# Patient Record
Sex: Male | Born: 1985 | Race: White | Hispanic: No | Marital: Married | Smoking: Current every day smoker
Health system: Southern US, Community
[De-identification: ages and names within clinical notes are randomized; demographics above are authoritative.]

## PROBLEM LIST (undated history)

## (undated) DIAGNOSIS — M1990 Unspecified osteoarthritis, unspecified site: Secondary | ICD-10-CM

## (undated) DIAGNOSIS — M109 Gout, unspecified: Secondary | ICD-10-CM

## (undated) DIAGNOSIS — J45909 Unspecified asthma, uncomplicated: Secondary | ICD-10-CM

## (undated) HISTORY — PX: HAND SURGERY: SHX662

---

## 2007-07-12 ENCOUNTER — Emergency Department (HOSPITAL_COMMUNITY): Admission: EM | Admit: 2007-07-12 | Discharge: 2007-07-12 | Payer: Self-pay | Admitting: Emergency Medicine

## 2013-01-14 ENCOUNTER — Encounter (HOSPITAL_COMMUNITY): Payer: Self-pay | Admitting: *Deleted

## 2013-01-14 ENCOUNTER — Emergency Department (HOSPITAL_COMMUNITY)
Admission: EM | Admit: 2013-01-14 | Discharge: 2013-01-15 | Disposition: A | Discharge status: Home / Self Care | Payer: Self-pay | Attending: Emergency Medicine | Admitting: Emergency Medicine

## 2013-01-14 ENCOUNTER — Emergency Department (HOSPITAL_COMMUNITY): Payer: Self-pay

## 2013-01-14 DIAGNOSIS — K529 Noninfective gastroenteritis and colitis, unspecified: Secondary | ICD-10-CM

## 2013-01-14 DIAGNOSIS — R6883 Chills (without fever): Secondary | ICD-10-CM | POA: Insufficient documentation

## 2013-01-14 DIAGNOSIS — R197 Diarrhea, unspecified: Secondary | ICD-10-CM | POA: Insufficient documentation

## 2013-01-14 DIAGNOSIS — K5289 Other specified noninfective gastroenteritis and colitis: Secondary | ICD-10-CM | POA: Insufficient documentation

## 2013-01-14 DIAGNOSIS — Z8739 Personal history of other diseases of the musculoskeletal system and connective tissue: Secondary | ICD-10-CM | POA: Insufficient documentation

## 2013-01-14 DIAGNOSIS — F17200 Nicotine dependence, unspecified, uncomplicated: Secondary | ICD-10-CM | POA: Insufficient documentation

## 2013-01-14 DIAGNOSIS — M2550 Pain in unspecified joint: Secondary | ICD-10-CM | POA: Insufficient documentation

## 2013-01-14 HISTORY — DX: Unspecified osteoarthritis, unspecified site: M19.90

## 2013-01-14 MED ORDER — PROMETHAZINE HCL 12.5 MG PO TABS
25.0000 mg | ORAL_TABLET | Freq: Once | ORAL | Status: AC
  Administered 2013-01-14: 25 mg via ORAL
  Filled 2013-01-14: qty 2

## 2013-01-14 NOTE — ED Notes (Signed)
Pt with vomiting and diarrhea since yesterday, denies any pain

## 2013-01-14 NOTE — ED Provider Notes (Signed)
CSN: [id number]     Arrival date & time 01/14/13  2138 History   First MD Initiated Contact with Patient 01/14/13 2248     Chief Complaint  Patient presents with  . Emesis  . Diarrhea   (Consider location/radiation/quality/duration/timing/severity/associated sxs/prior Treatment) Patient is a 27 y.o. male presenting with vomiting and diarrhea. The history is provided by the patient.  Emesis Severity:  Moderate Duration:  1 day Number of daily episodes:  4 Quality:  Stomach contents Able to tolerate:  Liquids How soon after eating does vomiting occur:  30 minutes Progression:  Worsening Chronicity:  New Recent urination:  Normal Relieved by:  Nothing Ineffective treatments:  None tried Associated symptoms: arthralgias, chills and diarrhea   Associated symptoms: no abdominal pain, no fever, no myalgias and no sore throat   Diarrhea:    Quality:  Watery   Number of occurrences:  2   Severity:  Moderate   Duration:  1 day   Timing:  Intermittent   Progression:  Unchanged Risk factors: no diabetes and no suspect food intake   Diarrhea Associated symptoms: arthralgias, chills and vomiting   Associated symptoms: no abdominal pain and no myalgias     Past Medical History  Diagnosis Date  . Arthritis    Past Surgical History  Procedure Laterality Date  . Hand surgery     History reviewed. No pertinent family history. History  Substance Use Topics  . Smoking status: Current Every Day Smoker    Types: Cigarettes  . Smokeless tobacco: Not on file  . Alcohol Use: No    Review of Systems  Constitutional: Positive for chills. Negative for activity change.       All ROS Neg except as noted in HPI  HENT: Negative for nosebleeds, sore throat and neck pain.   Eyes: Negative for photophobia and discharge.  Respiratory: Negative for cough, shortness of breath and wheezing.   Cardiovascular: Negative for chest pain and palpitations.  Gastrointestinal: Positive for vomiting and  diarrhea. Negative for abdominal pain and blood in stool.  Genitourinary: Negative for dysuria, frequency and hematuria.  Musculoskeletal: Positive for arthralgias. Negative for myalgias and back pain.  Skin: Negative.   Neurological: Negative for dizziness, seizures and speech difficulty.  Psychiatric/Behavioral: Negative for hallucinations and confusion.    Allergies  Sulfa antibiotics and Yellow jacket venom  Home Medications   Current Outpatient Rx  Name  Route  Sig  Dispense  Refill  . acetaminophen (TYLENOL) 500 MG tablet   Oral   Take 500 mg by mouth every 6 (six) hours as needed for pain.          BP 124/77  Pulse 88  Temp(Src) 97.9 F (36.6 C) (Oral)  Resp 18  SpO2 97% Physical Exam  Nursing note and vitals reviewed. Constitutional: He is oriented to person, place, and time. He appears well-developed and well-nourished.  Non-toxic appearance.  HENT:  Head: Normocephalic.  Right Ear: Tympanic membrane and external ear normal.  Left Ear: Tympanic membrane and external ear normal.  Eyes: EOM and lids are normal. Pupils are equal, round, and reactive to light.  Neck: Normal range of motion. Neck supple. Carotid bruit is not present.  Cardiovascular: Normal rate, regular rhythm, normal heart sounds, intact distal pulses and normal pulses.   Pulmonary/Chest: Breath sounds normal. No respiratory distress.  Abdominal: Soft. Bowel sounds are normal. There is no tenderness. There is no guarding.  Musculoskeletal: Normal range of motion.  Lymphadenopathy:  Head (right side): No submandibular adenopathy present.       Head (left side): No submandibular adenopathy present.    He has no cervical adenopathy.  Neurological: He is alert and oriented to person, place, and time. He has normal strength. No cranial nerve deficit or sensory deficit.  Skin: Skin is warm and dry.  Psychiatric: He has a normal mood and affect. His speech is normal.    ED Course  Procedures  (including critical care time) Labs Review Labs Reviewed - No data to display Imaging Review No results found. Pulse ox 97% on room air. WNL by my interpretation. MDM  No diagnosis found. *I have reviewed nursing notes, vital signs, and all appropriate lab and imaging results for this patient.**  Vital signs stable. No orthostatic changes. Acute Abd xray is negative for acute problem. Pt given oral promethazine. No vomiting or diarrhea in the ED. Pt able to drink without problem in ED. Plan - wash hands frequently. Rx for promethazine. Return if not improving.  Kathie Dike, PA-C 01/15/13 [phone]

## 2013-01-15 MED ORDER — PROMETHAZINE HCL 25 MG PO TABS: ORAL_TABLET | ORAL | Status: DC

## 2013-01-15 NOTE — ED Notes (Signed)
Patient tolerating fluids well. No vomiting or diarrhea since in ED. Patient sleeping when this nurse entered room to reassess.

## 2013-01-15 NOTE — ED Provider Notes (Signed)
Medical screening examination/treatment/procedure(s) were performed by non-physician practitioner and as supervising physician I was immediately available for consultation/collaboration.   Addelynn Batte W Veta Dambrosia, MD 01/15/13 0215 

## 2013-05-11 ENCOUNTER — Encounter (HOSPITAL_COMMUNITY): Payer: Self-pay | Admitting: Emergency Medicine

## 2013-05-11 ENCOUNTER — Emergency Department (HOSPITAL_COMMUNITY)
Admission: EM | Admit: 2013-05-11 | Discharge: 2013-05-11 | Disposition: A | Discharge status: Home / Self Care | Payer: Self-pay | Attending: Emergency Medicine | Admitting: Emergency Medicine

## 2013-05-11 ENCOUNTER — Emergency Department (HOSPITAL_COMMUNITY): Payer: Self-pay

## 2013-05-11 DIAGNOSIS — S8253XA Displaced fracture of medial malleolus of unspecified tibia, initial encounter for closed fracture: Secondary | ICD-10-CM

## 2013-05-11 DIAGNOSIS — M129 Arthropathy, unspecified: Secondary | ICD-10-CM | POA: Insufficient documentation

## 2013-05-11 DIAGNOSIS — R609 Edema, unspecified: Secondary | ICD-10-CM | POA: Insufficient documentation

## 2013-05-11 DIAGNOSIS — Y9389 Activity, other specified: Secondary | ICD-10-CM | POA: Insufficient documentation

## 2013-05-11 DIAGNOSIS — Y929 Unspecified place or not applicable: Secondary | ICD-10-CM | POA: Insufficient documentation

## 2013-05-11 DIAGNOSIS — J45909 Unspecified asthma, uncomplicated: Secondary | ICD-10-CM | POA: Insufficient documentation

## 2013-05-11 DIAGNOSIS — X500XXA Overexertion from strenuous movement or load, initial encounter: Secondary | ICD-10-CM | POA: Insufficient documentation

## 2013-05-11 DIAGNOSIS — S92909A Unspecified fracture of unspecified foot, initial encounter for closed fracture: Secondary | ICD-10-CM | POA: Insufficient documentation

## 2013-05-11 DIAGNOSIS — F17200 Nicotine dependence, unspecified, uncomplicated: Secondary | ICD-10-CM | POA: Insufficient documentation

## 2013-05-11 HISTORY — DX: Unspecified asthma, uncomplicated: J45.909

## 2013-05-11 MED ORDER — TRAMADOL HCL 50 MG PO TABS: 50.0000 mg | ORAL_TABLET | Freq: Four times a day (QID) | ORAL | Status: DC | PRN

## 2013-05-11 NOTE — ED Notes (Signed)
Per pt report: pt was packing bags and "twisted around" the right ankle.  Ankle is swollen.

## 2013-05-11 NOTE — ED Provider Notes (Addendum)
CSN: [id number]     Arrival date & time 05/11/13  2215 History   This chart was scribed for non-physician practitioner Marlon Pel, PA-C, working with Blane Ohara, MD, by Yevette Mcilvain, ED Scribe. This patient was seen in room WTR5/WTR5 and the patient's care was started at 10:56 PM.  First MD Initiated Contact with Patient 05/11/13 2236    Chief Complaint  Patient presents with  . Ankle Pain   The history is provided by the patient. No language interpreter was used.   HPI Comments: Ronald Mitchell is a 27 y.o. male, with a h/o arthritis, who presents to the Emergency Department complaining of acute right ankle pain which began yesterday when he twisted his ankle accidentally while trying to get all of him and his wifes things together to deliver their baby. He waited until this evening because the baby was delivered this morning and he has been unable to leave. He rates his pain as 6/10. The pt has experienced increased swelling associated with the site. He reports increased pain with weight-bearing, and he reports the pain is too great to allow for ambulation. He has a h/o previous injuries to the site. He is a current smoker.   Past Medical History  Diagnosis Date  . Arthritis   . Asthma    Past Surgical History  Procedure Laterality Date  . Hand surgery     No family history on file. History  Substance Use Topics  . Smoking status: Current Every Day Smoker -- 0.50 packs/day    Types: Cigarettes  . Smokeless tobacco: Not on file  . Alcohol Use: No    Review of Systems  Musculoskeletal: Positive for arthralgias.  All other systems reviewed and are negative.    Allergies  Sulfa antibiotics and Yellow jacket venom  Home Medications   Current Outpatient Rx  Name  Route  Sig  Dispense  Refill  . acetaminophen (TYLENOL) 500 MG tablet   Oral   Take 500 mg by mouth every 6 (six) hours as needed for pain.         . promethazine (PHENERGAN) 25 MG tablet      1 or  2 po q6h prn n/v.   15 tablet   0   . traMADol (ULTRAM) 50 MG tablet   Oral   Take 1 tablet (50 mg total) by mouth every 6 (six) hours as needed.   15 tablet   0    Triage Vitals: BP 139/80  Pulse 118  Resp 20  SpO2 99%  Physical Exam  Nursing note and vitals reviewed. Constitutional: He is oriented to person, place, and time. He appears well-developed and well-nourished. No distress.  HENT:  Head: Normocephalic and atraumatic.  Eyes: EOM are normal.  Neck: Neck supple. No tracheal deviation present.  Cardiovascular: Normal rate.   Pulmonary/Chest: Effort normal. No respiratory distress.  Musculoskeletal: Normal range of motion. He exhibits tenderness.  Significant swelling to right ankle. Tenderness most prominent at right medial malleoli. Achilles is intact. Strong pulses. Full ROM of toes.   Neurological: He is alert and oriented to person, place, and time.  Skin: Skin is warm and dry.  Psychiatric: He has a normal mood and affect. His behavior is normal.    ED Course  Procedures (including critical care time)  DIAGNOSTIC STUDIES: Oxygen Saturation is 99% on room air, normal by my interpretation.    COORDINATION OF CARE:  11:00 PM- Discussed treatment plan with patient, which includes a cam-walker  boot.and a prescription for Ultram We discussed immobilizing with splint but patient was hesitant because he has a new born child and will be unable to help if he cant walk.  The patient agreed to the plan. Also reminded the pt to continue icing the site. He understands need to follow-up with Ortho.  Labs Review Labs Reviewed - No data to display Imaging Review Dg Ankle Complete Right  05/11/2013   CLINICAL DATA:  Right ankle pain.  No injury.  EXAM: RIGHT ANKLE - COMPLETE 3+ VIEW  COMPARISON:  None.  FINDINGS: There is a tiny bone fragment adjacent to the medial malleolus, likely an old avulsion. Soft tissue swelling is present on both sides of the ankle on the frontal  view. The ankle mortise is congruent. The talar dome is intact. There is no fracture or acute osseous abnormality. On the lateral view, there does appear to be an ankle effusion.  IMPRESSION: Small avulsion fragment from the medial malleolus may be old. Ankle effusion.   Electronically Signed   By: Andreas Newport M.D.   On: 05/11/2013 22:49    EKG Interpretation   None       MDM   1. Fx medial malleolus-closed    27 y.o.Marolyn Hammock Kopko's evaluation in the Emergency Department is complete. It has been determined that no acute conditions requiring further emergency intervention are present at this time. The patient/guardian have been advised of the diagnosis and plan. We have discussed signs and symptoms that warrant return to the ED, such as changes or worsening in symptoms.  Vital signs are stable at discharge. Filed Vitals:   05/11/13 2217  BP: 139/80  Pulse: 118  Resp: 20    Patient/guardian has voiced understanding and agreed to follow-up with the PCP or specialist.  I personally performed the services described in this documentation, which was scribed in my presence. The recorded information has been reviewed and is accurate.     Dorthula Matas, PA-C 05/12/13 0023  Dorthula Matas, PA-C 06/09/13 1715

## 2013-05-11 NOTE — ED Notes (Signed)
Pt in the restroom.

## 2013-05-12 NOTE — ED Provider Notes (Signed)
Medical screening examination/treatment/procedure(s) were performed by non-physician practitioner and as supervising physician I was immediately available for consultation/collaboration.  EKG Interpretation   None         Hennessy Bartel M Myrtice Lowdermilk, MD 05/12/13 0814 

## 2013-06-10 NOTE — ED Provider Notes (Signed)
Medical screening examination/treatment/procedure(s) were performed by non-physician practitioner and as supervising physician I was immediately available for consultation/collaboration.  EKG Interpretation   None         Ronald SkeensJoshua M Acadia Thammavong, MD 06/10/13 [phone]

## 2014-05-31 ENCOUNTER — Encounter (HOSPITAL_COMMUNITY): Payer: Self-pay

## 2014-05-31 ENCOUNTER — Emergency Department (HOSPITAL_COMMUNITY): Payer: PRIVATE HEALTH INSURANCE

## 2014-05-31 ENCOUNTER — Emergency Department (HOSPITAL_COMMUNITY)
Admission: EM | Admit: 2014-05-31 | Discharge: 2014-05-31 | Disposition: A | Discharge status: Home / Self Care | Payer: PRIVATE HEALTH INSURANCE | Attending: Emergency Medicine | Admitting: Emergency Medicine

## 2014-05-31 DIAGNOSIS — Z720 Tobacco use: Secondary | ICD-10-CM | POA: Diagnosis not present

## 2014-05-31 DIAGNOSIS — M1990 Unspecified osteoarthritis, unspecified site: Secondary | ICD-10-CM | POA: Diagnosis not present

## 2014-05-31 DIAGNOSIS — Z7952 Long term (current) use of systemic steroids: Secondary | ICD-10-CM | POA: Insufficient documentation

## 2014-05-31 DIAGNOSIS — R509 Fever, unspecified: Secondary | ICD-10-CM

## 2014-05-31 DIAGNOSIS — R0789 Other chest pain: Secondary | ICD-10-CM | POA: Diagnosis not present

## 2014-05-31 DIAGNOSIS — Z792 Long term (current) use of antibiotics: Secondary | ICD-10-CM | POA: Insufficient documentation

## 2014-05-31 DIAGNOSIS — R51 Headache: Secondary | ICD-10-CM | POA: Diagnosis not present

## 2014-05-31 DIAGNOSIS — J45909 Unspecified asthma, uncomplicated: Secondary | ICD-10-CM | POA: Diagnosis not present

## 2014-05-31 DIAGNOSIS — J40 Bronchitis, not specified as acute or chronic: Secondary | ICD-10-CM

## 2014-05-31 DIAGNOSIS — R05 Cough: Secondary | ICD-10-CM | POA: Diagnosis present

## 2014-05-31 DIAGNOSIS — J45901 Unspecified asthma with (acute) exacerbation: Secondary | ICD-10-CM

## 2014-05-31 LAB — CBC WITH DIFFERENTIAL/PLATELET
BASOS ABS: 0.1 10*3/uL (ref 0.0–0.1)
Basophils Relative: 1 % (ref 0–1)
Eosinophils Absolute: 0.1 10*3/uL (ref 0.0–0.7)
Eosinophils Relative: 1 % (ref 0–5)
HEMATOCRIT: 40.6 % (ref 39.0–52.0)
Hemoglobin: 13.9 g/dL (ref 13.0–17.0)
LYMPHS PCT: 22 % (ref 12–46)
Lymphs Abs: 2.7 10*3/uL (ref 0.7–4.0)
MCH: 32 pg (ref 26.0–34.0)
MCHC: 34.2 g/dL (ref 30.0–36.0)
MCV: 93.3 fL (ref 78.0–100.0)
MONO ABS: 1.7 10*3/uL — AB (ref 0.1–1.0)
Monocytes Relative: 14 % — ABNORMAL HIGH (ref 3–12)
NEUTROS PCT: 63 % (ref 43–77)
Neutro Abs: 7.9 10*3/uL — ABNORMAL HIGH (ref 1.7–7.7)
PLATELETS: 205 10*3/uL (ref 150–400)
RBC: 4.35 MIL/uL (ref 4.22–5.81)
RDW: 13.1 % (ref 11.5–15.5)
WBC: 12.6 10*3/uL — AB (ref 4.0–10.5)

## 2014-05-31 LAB — COMPREHENSIVE METABOLIC PANEL
ALT: 68 U/L — ABNORMAL HIGH (ref 0–53)
AST: 47 U/L — ABNORMAL HIGH (ref 0–37)
Albumin: 4 g/dL (ref 3.5–5.2)
Alkaline Phosphatase: 52 U/L (ref 39–117)
Anion gap: 6 (ref 5–15)
BILIRUBIN TOTAL: 1 mg/dL (ref 0.3–1.2)
BUN: 15 mg/dL (ref 6–23)
CALCIUM: 8.3 mg/dL — AB (ref 8.4–10.5)
CHLORIDE: 102 meq/L (ref 96–112)
CO2: 27 mmol/L (ref 19–32)
CREATININE: 0.93 mg/dL (ref 0.50–1.35)
GLUCOSE: 109 mg/dL — AB (ref 70–99)
POTASSIUM: 3.5 mmol/L (ref 3.5–5.1)
SODIUM: 135 mmol/L (ref 135–145)
TOTAL PROTEIN: 7.1 g/dL (ref 6.0–8.3)

## 2014-05-31 LAB — RAPID STREP SCREEN (MED CTR MEBANE ONLY): STREPTOCOCCUS, GROUP A SCREEN (DIRECT): NEGATIVE

## 2014-05-31 MED ORDER — IBUPROFEN 800 MG PO TABS
800.0000 mg | ORAL_TABLET | Freq: Once | ORAL | Status: AC
  Administered 2014-05-31: 800 mg via ORAL
  Filled 2014-05-31: qty 1

## 2014-05-31 MED ORDER — IPRATROPIUM-ALBUTEROL 0.5-2.5 (3) MG/3ML IN SOLN
3.0000 mL | Freq: Once | RESPIRATORY_TRACT | Status: AC
  Administered 2014-05-31: 3 mL via RESPIRATORY_TRACT
  Filled 2014-05-31: qty 3

## 2014-05-31 MED ORDER — AZITHROMYCIN 250 MG PO TABS: 250.0000 mg | ORAL_TABLET | Freq: Every day | ORAL | Status: DC

## 2014-05-31 MED ORDER — ALBUTEROL SULFATE HFA 108 (90 BASE) MCG/ACT IN AERS: 2.0000 | INHALATION_SPRAY | Freq: Four times a day (QID) | RESPIRATORY_TRACT | Status: DC | PRN

## 2014-05-31 MED ORDER — PREDNISONE 50 MG PO TABS: ORAL_TABLET | ORAL | Status: DC

## 2014-05-31 MED ORDER — PREDNISONE 50 MG PO TABS
60.0000 mg | ORAL_TABLET | Freq: Once | ORAL | Status: AC
  Administered 2014-05-31: 60 mg via ORAL
  Filled 2014-05-31 (×2): qty 1

## 2014-05-31 NOTE — Discharge Instructions (Signed)
Acute Bronchitis Stop smoking. Take the steroids and antibiotics as prescribed. Use inhaler as needed for difficulty breathing. Return to the ED with new or worsening symptoms. Bronchitis is inflammation of the airways that extend from the windpipe into the lungs (bronchi). The inflammation often causes mucus to develop. This leads to a cough, which is the most common symptom of bronchitis.  In acute bronchitis, the condition usually develops suddenly and goes away over time, usually in a couple weeks. Smoking, allergies, and asthma can make bronchitis worse. Repeated episodes of bronchitis may cause further lung problems.  CAUSES Acute bronchitis is most often caused by the same virus that causes a cold. The virus can spread from person to person (contagious) through coughing, sneezing, and touching contaminated objects. SIGNS AND SYMPTOMS   Cough.   Fever.   Coughing up mucus.   Body aches.   Chest congestion.   Chills.   Shortness of breath.   Sore throat.  DIAGNOSIS  Acute bronchitis is usually diagnosed through a physical exam. Your health care provider will also ask you questions about your medical history. Tests, such as chest X-rays, are sometimes done to rule out other conditions.  TREATMENT  Acute bronchitis usually goes away in a couple weeks. Oftentimes, no medical treatment is necessary. Medicines are sometimes given for relief of fever or cough. Antibiotic medicines are usually not needed but may be prescribed in certain situations. In some cases, an inhaler may be recommended to help reduce shortness of breath and control the cough. A cool mist vaporizer may also be used to help thin bronchial secretions and make it easier to clear the chest.  HOME CARE INSTRUCTIONS  Get plenty of rest.   Drink enough fluids to keep your urine clear or pale yellow (unless you have a medical condition that requires fluid restriction). Increasing fluids may help thin your  respiratory secretions (sputum) and reduce chest congestion, and it will prevent dehydration.   Take medicines only as directed by your health care provider.  If you were prescribed an antibiotic medicine, finish it all even if you start to feel better.  Avoid smoking and secondhand smoke. Exposure to cigarette smoke or irritating chemicals will make bronchitis worse. If you are a smoker, consider using nicotine gum or skin patches to help control withdrawal symptoms. Quitting smoking will help your lungs heal faster.   Reduce the chances of another bout of acute bronchitis by washing your hands frequently, avoiding people with cold symptoms, and trying not to touch your hands to your mouth, nose, or eyes.   Keep all follow-up visits as directed by your health care provider.  SEEK MEDICAL CARE IF: Your symptoms do not improve after 1 week of treatment.  SEEK IMMEDIATE MEDICAL CARE IF:  You develop an increased fever or chills.   You have chest pain.   You have severe shortness of breath.  You have bloody sputum.   You develop dehydration.  You faint or repeatedly feel like you are going to pass out.  You develop repeated vomiting.  You develop a severe headache. MAKE SURE YOU:   Understand these instructions.  Will watch your condition.  Will get help right away if you are not doing well or get worse. Document Released: 06/15/2004 Document Revised: 09/22/2013 Document Reviewed: 10/29/2012 [facility] Patient Information 2015 [employer], [place]. This information is not intended to replace advice given to you by your health care provider. Make sure you discuss any questions you have with your health care  provider.

## 2014-05-31 NOTE — ED Provider Notes (Signed)
CSN: [id number]     Arrival date & time 05/31/14  1141 History  This chart was scribed for Glynn OctaveStephen Ludean Duhart, MD by Murriel HopperAlec Bankhead, ED Scribe. This patient was seen in room APA09/APA09 and the patient's care was started at 12:43 PM.    Chief Complaint  Patient presents with  . Fever  . Cough   The history is provided by the patient. No language interpreter was used.     HPI Comments: Ronald Mitchell is a 29 y.o. male with Asthma who presents to the Emergency Department complaining of a worsening fever with associated cough, congestion, difficulty breathing, sore throat, headache, and chest tightness that began 5 days ago. Pt states that his cough is dry and non-productive. Pt also notes that he has had occasional vomiting induced by his cough, and states that his chest only feels tight or hurts whenever he begins to cough. Pt notes that he has had occasional headaches with other associated symptoms. Pt states he has never had difficulty breathing like this before. Pt states that he has been taking Tylenol PM for his fever, and taking Mucinex as needed for congestion and cough with little relief. Pt states his last dose of Mucinex was around 0830 this AM. Pt states that he is a smoker and smokes half a pack per day. Pt states that he did not receive a flu shot this season.   Past Medical History  Diagnosis Date  . Arthritis   . Asthma    Past Surgical History  Procedure Laterality Date  . Hand surgery     No family history on file. History  Substance Use Topics  . Smoking status: Current Every Day Smoker -- 0.50 packs/day    Types: Cigarettes  . Smokeless tobacco: Not on file  . Alcohol Use: No    Review of Systems  Constitutional: Positive for fever.  HENT: Positive for congestion and sore throat.   Respiratory: Positive for chest tightness and shortness of breath.   Neurological: Positive for headaches.  All other systems reviewed and are negative.     Allergies  Sulfa  antibiotics and Yellow jacket venom  Home Medications   Prior to Admission medications   Medication Sig Start Date End Date Taking? Authorizing Provider  acetaminophen (TYLENOL) 500 MG tablet Take 500 mg by mouth every 6 (six) hours as needed for pain.   Yes Historical Provider, MD  guaiFENesin (MUCINEX) 600 MG 12 hr tablet Take 1,200 mg by mouth 2 (two) times daily as needed for cough (congestion).   Yes Historical Provider, MD  albuterol (PROVENTIL HFA;VENTOLIN HFA) 108 (90 BASE) MCG/ACT inhaler Inhale 2 puffs into the lungs every 6 (six) hours as needed for wheezing or shortness of breath. 05/31/14   Glynn OctaveStephen Adolfo Granieri, MD  azithromycin (ZITHROMAX) 250 MG tablet Take 1 tablet (250 mg total) by mouth daily. Take first 2 tablets together, then 1 every day until finished. 05/31/14   Glynn OctaveStephen Nansi Birmingham, MD  predniSONE (DELTASONE) 50 MG tablet 1 tablet PO daily 05/31/14   Glynn OctaveStephen Jimmie Dattilio, MD  promethazine (PHENERGAN) 25 MG tablet 1 or 2 po q6h prn n/v. Patient not taking: Reported on 05/31/2014 01/15/13   Kathie DikeHobson M Bryant, PA-C  traMADol (ULTRAM) 50 MG tablet Take 1 tablet (50 mg total) by mouth every 6 (six) hours as needed. Patient not taking: Reported on 05/31/2014 05/11/13   Dorthula Matasiffany G Greene, PA-C   BP 116/70 mmHg  Pulse 102  Temp(Src) 99.7 F (37.6 C) (Oral)  Resp 15  Ht  (1.676 m)  Wt 260 lb (117.935 kg)  BMI 41.99 kg/m2  SpO2 95% Physical Exam  Constitutional: He is oriented to person, place, and time. He appears well-developed and well-nourished. No distress.  HENT:  Head: Normocephalic and atraumatic.  Mouth/Throat: Oropharyngeal exudate present.  Erythematous tonsils with exudates  Eyes: Conjunctivae and EOM are normal. Pupils are equal, round, and reactive to light.  Neck: Normal range of motion. Neck supple.  No meningismus.  Cardiovascular: Normal rate, regular rhythm, normal heart sounds and intact distal pulses.   No murmur heard. Pulmonary/Chest: Effort normal. He has  wheezes.  Coarse breath sounds with expiratory wheezing   Abdominal: Soft. There is no tenderness. There is no rebound and no guarding.  Musculoskeletal: Normal range of motion. He exhibits no edema or tenderness.  Neurological: He is alert and oriented to person, place, and time. No cranial nerve deficit. He exhibits normal muscle tone. Coordination normal.  No ataxia on finger to nose bilaterally. No pronator drift. 5/5 strength throughout. CN 2-12 intact. Negative Romberg. Equal grip strength. Sensation intact. Gait is normal.   Skin: Skin is warm.  Psychiatric: He has a normal mood and affect. His behavior is normal.  Nursing note and vitals reviewed.   ED Course  Procedures (including critical care time)  DIAGNOSTIC STUDIES: Oxygen Saturation is 94% on RA, adequate by my interpretation.    COORDINATION OF CARE: 12:54 PM Discussed treatment plan with pt at bedside and pt agreed to plan.   Labs Review Labs Reviewed  CBC WITH DIFFERENTIAL - Abnormal; Notable for the following:    WBC 12.6 (*)    Neutro Abs 7.9 (*)    Monocytes Relative 14 (*)    Monocytes Absolute 1.7 (*)    All other components within normal limits  COMPREHENSIVE METABOLIC PANEL - Abnormal; Notable for the following:    Glucose, Bld 109 (*)    Calcium 8.3 (*)    AST 47 (*)    ALT 68 (*)    All other components within normal limits  RAPID STREP SCREEN  CULTURE, GROUP A STREP    Imaging Review Dg Chest 2 View  05/31/2014   CLINICAL DATA:  Five day history of fever and cough  EXAM: CHEST  2 VIEW  COMPARISON:  January 14, 2013  FINDINGS: Lungs are clear. Heart size and pulmonary vascularity are normal. No adenopathy. No bone lesions.  IMPRESSION: No edema or consolidation.   Electronically Signed   By: Bretta Bang M.D.   On: 05/31/2014 13:58     EKG Interpretation   Date/Time:  Sunday May 31 2014 13:15:12 EST Ventricular Rate:  105 PR Interval:  170 QRS Duration: 97 QT Interval:  351 QTC  Calculation: 464 R Axis:   71 Text Interpretation:  Sinus tachycardia No previous ECGs available  Confirmed by Hadleigh Felber  MD, Kaelin Holford [phone]) on 05/31/2014 1:22:22 PM      MDM   Final diagnoses:  Bronchitis  Asthma exacerbation   5 day history of cough, congestion, fever. Using Mucinex. Has had some posttussive chest pain and posttussive emesis. History of asthma as a child but does not use medications. He is a smoker.  On exam, patient is febrile, tachycardic in no distress. He has congested cough and scattered expiratory wheezing. Is given a nebulizer and steroids.   CXR negative for infiltrate.  Sinus tachy on EKG.  No hypoxia.  Wheezing on exam, nebs and steroids given. Doubt ACS or PE.  Chest pain  only with coughing. Wheezing on exam Feels improved, ambulatory without desaturation.  Treat for asthma exacerbation with bronchitis.  Smoking cessation encouraged.  BP 116/70 mmHg  Pulse 102  Temp(Src) 99.7 F (37.6 C) (Oral)  Resp 15  Ht  (1.676 m)  Wt 260 lb (117.935 kg)  BMI 41.99 kg/m2  SpO2 95%    Glynn Octave, MD 05/31/14 1736

## 2014-05-31 NOTE — ED Notes (Signed)
Pt reports cough, congestion, fever since TU[place]day.  Has been taking tylenol prn for fever, last dose at 3am this morning.  Also has been taking mucinex prn, last dose around 0830 this am.  Reports history of asthma and had been on albuterol inhaler and a steroid inhaler but hasn't used it since he was 14.

## 2014-05-31 NOTE — ED Notes (Signed)
Pulse ox while ambulating was 92.

## 2014-06-02 LAB — CULTURE, GROUP A STREP

## 2015-03-15 ENCOUNTER — Emergency Department (HOSPITAL_COMMUNITY): Payer: BLUE CROSS/BLUE SHIELD

## 2015-03-15 ENCOUNTER — Other Ambulatory Visit: Payer: Self-pay

## 2015-03-15 ENCOUNTER — Encounter (HOSPITAL_COMMUNITY): Payer: Self-pay | Admitting: Emergency Medicine

## 2015-03-15 ENCOUNTER — Emergency Department (HOSPITAL_COMMUNITY)
Admission: EM | Admit: 2015-03-15 | Discharge: 2015-03-15 | Disposition: A | Discharge status: Home / Self Care | Payer: BLUE CROSS/BLUE SHIELD | Attending: Emergency Medicine | Admitting: Emergency Medicine

## 2015-03-15 DIAGNOSIS — R059 Cough, unspecified: Secondary | ICD-10-CM

## 2015-03-15 DIAGNOSIS — Y939 Activity, unspecified: Secondary | ICD-10-CM | POA: Insufficient documentation

## 2015-03-15 DIAGNOSIS — M1990 Unspecified osteoarthritis, unspecified site: Secondary | ICD-10-CM | POA: Insufficient documentation

## 2015-03-15 DIAGNOSIS — Z720 Tobacco use: Secondary | ICD-10-CM | POA: Diagnosis not present

## 2015-03-15 DIAGNOSIS — J45901 Unspecified asthma with (acute) exacerbation: Secondary | ICD-10-CM | POA: Diagnosis not present

## 2015-03-15 DIAGNOSIS — R000 Tachycardia, unspecified: Secondary | ICD-10-CM | POA: Diagnosis not present

## 2015-03-15 DIAGNOSIS — X58XXXA Exposure to other specified factors, initial encounter: Secondary | ICD-10-CM | POA: Insufficient documentation

## 2015-03-15 DIAGNOSIS — Z79899 Other long term (current) drug therapy: Secondary | ICD-10-CM | POA: Insufficient documentation

## 2015-03-15 DIAGNOSIS — R509 Fever, unspecified: Secondary | ICD-10-CM | POA: Diagnosis not present

## 2015-03-15 DIAGNOSIS — Y929 Unspecified place or not applicable: Secondary | ICD-10-CM | POA: Insufficient documentation

## 2015-03-15 DIAGNOSIS — R112 Nausea with vomiting, unspecified: Secondary | ICD-10-CM | POA: Insufficient documentation

## 2015-03-15 DIAGNOSIS — T148XXA Other injury of unspecified body region, initial encounter: Secondary | ICD-10-CM

## 2015-03-15 DIAGNOSIS — Z7952 Long term (current) use of systemic steroids: Secondary | ICD-10-CM | POA: Insufficient documentation

## 2015-03-15 DIAGNOSIS — Y998 Other external cause status: Secondary | ICD-10-CM | POA: Diagnosis not present

## 2015-03-15 DIAGNOSIS — T148 Other injury of unspecified body region: Secondary | ICD-10-CM | POA: Diagnosis not present

## 2015-03-15 DIAGNOSIS — R05 Cough: Secondary | ICD-10-CM | POA: Diagnosis present

## 2015-03-15 DIAGNOSIS — R1011 Right upper quadrant pain: Secondary | ICD-10-CM | POA: Diagnosis not present

## 2015-03-15 DIAGNOSIS — R197 Diarrhea, unspecified: Secondary | ICD-10-CM | POA: Diagnosis not present

## 2015-03-15 LAB — CBC WITH DIFFERENTIAL/PLATELET
Basophils Absolute: 0.1 10*3/uL (ref 0.0–0.1)
Basophils Relative: 1 %
EOS ABS: 0.3 10*3/uL (ref 0.0–0.7)
EOS PCT: 2 %
HCT: 41.6 % (ref 39.0–52.0)
Hemoglobin: 14.5 g/dL (ref 13.0–17.0)
LYMPHS ABS: 3.5 10*3/uL (ref 0.7–4.0)
Lymphocytes Relative: 22 %
MCH: 32.4 pg (ref 26.0–34.0)
MCHC: 34.9 g/dL (ref 30.0–36.0)
MCV: 93.1 fL (ref 78.0–100.0)
MONO ABS: 1.8 10*3/uL — AB (ref 0.1–1.0)
MONOS PCT: 12 %
Neutro Abs: 10 10*3/uL — ABNORMAL HIGH (ref 1.7–7.7)
Neutrophils Relative %: 63 %
PLATELETS: 229 10*3/uL (ref 150–400)
RBC: 4.47 MIL/uL (ref 4.22–5.81)
RDW: 13 % (ref 11.5–15.5)
WBC: 15.7 10*3/uL — AB (ref 4.0–10.5)

## 2015-03-15 LAB — COMPREHENSIVE METABOLIC PANEL
ALT: 57 U/L (ref 17–63)
ANION GAP: 10 (ref 5–15)
AST: 27 U/L (ref 15–41)
Albumin: 3.9 g/dL (ref 3.5–5.0)
Alkaline Phosphatase: 56 U/L (ref 38–126)
BUN: 13 mg/dL (ref 6–20)
CHLORIDE: 102 mmol/L (ref 101–111)
CO2: 27 mmol/L (ref 22–32)
Calcium: 8.8 mg/dL — ABNORMAL LOW (ref 8.9–10.3)
Creatinine, Ser: 0.97 mg/dL (ref 0.61–1.24)
Glucose, Bld: 129 mg/dL — ABNORMAL HIGH (ref 65–99)
POTASSIUM: 3.5 mmol/L (ref 3.5–5.1)
SODIUM: 139 mmol/L (ref 135–145)
Total Bilirubin: 1.5 mg/dL — ABNORMAL HIGH (ref 0.3–1.2)
Total Protein: 7.2 g/dL (ref 6.5–8.1)

## 2015-03-15 LAB — LIPASE, BLOOD: Lipase: 22 U/L (ref 11–51)

## 2015-03-15 LAB — D-DIMER, QUANTITATIVE (NOT AT ARMC): D DIMER QUANT: 0.28 ug{FEU}/mL (ref 0.00–0.48)

## 2015-03-15 LAB — I-STAT CG4 LACTIC ACID, ED: LACTIC ACID, VENOUS: 1.89 mmol/L (ref 0.5–2.0)

## 2015-03-15 MED ORDER — NAPROXEN 500 MG PO TABS: 500.0000 mg | ORAL_TABLET | Freq: Two times a day (BID) | ORAL | Status: DC

## 2015-03-15 MED ORDER — SODIUM CHLORIDE 0.9 % IV BOLUS (SEPSIS)
1000.0000 mL | Freq: Once | INTRAVENOUS | Status: AC
  Administered 2015-03-15: 1000 mL via INTRAVENOUS

## 2015-03-15 NOTE — Discharge Instructions (Signed)

## 2015-03-15 NOTE — ED Provider Notes (Signed)
CSN: [id number]     Arrival date & time 03/15/15  1423 History   First MD Initiated Contact with Patient 03/15/15 1430     Chief Complaint  Patient presents with  . Cough     (Consider location/radiation/quality/duration/timing/severity/associated sxs/prior Treatment) HPI Comments: Pain right side 2 days, worse with movement, deep breaths, cough Sharp, right rib SOB for 2 days, 6/10 No long trips, recent surgeries, no hx of PE/DVT Caffeine, mucinex     Patient is a 29 y.o. male presenting with cough.  Cough Cough characteristics:  Productive Sputum characteristics:  Clear Severity:  Severe Onset quality:  Gradual Duration:  2 weeks Timing:  Constant Progression:  Unchanged Chronicity:  New Associated symptoms: fever   Associated symptoms: no chest pain, no headaches, no rash, no shortness of breath and no sore throat     Past Medical History  Diagnosis Date  . Arthritis   . Asthma    Past Surgical History  Procedure Laterality Date  . Hand surgery     History reviewed. No pertinent family history. Social History  Substance Use Topics  . Smoking status: Current Every Day Smoker -- 0.50 packs/day    Types: Cigarettes  . Smokeless tobacco: None  . Alcohol Use: No    Review of Systems  Constitutional: Positive for fever.  HENT: Negative for sore throat.   Eyes: Negative for visual disturbance.  Respiratory: Positive for cough. Negative for shortness of breath.   Cardiovascular: Negative for chest pain.  Gastrointestinal: Positive for nausea, vomiting (from coughing), abdominal pain and diarrhea (x2 days, mild).  Genitourinary: Negative for difficulty urinating.  Musculoskeletal: Negative for back pain and neck stiffness.  Skin: Negative for rash.  Neurological: Negative for syncope and headaches.      Allergies  Sulfa antibiotics and Yellow jacket venom  Home Medications   Prior to Admission medications   Medication Sig Start Date End Date Taking?  Authorizing Provider  acetaminophen (TYLENOL) 500 MG tablet Take 500 mg by mouth every 6 (six) hours as needed for pain.    Historical Provider, MD  albuterol (PROVENTIL HFA;VENTOLIN HFA) 108 (90 BASE) MCG/ACT inhaler Inhale 2 puffs into the lungs every 6 (six) hours as needed for wheezing or shortness of breath. 05/31/14   Glynn Octave, MD  azithromycin (ZITHROMAX) 250 MG tablet Take 1 tablet (250 mg total) by mouth daily. Take first 2 tablets together, then 1 every day until finished. 05/31/14   Glynn Octave, MD  guaiFENesin (MUCINEX) 600 MG 12 hr tablet Take 1,200 mg by mouth 2 (two) times daily as needed for cough (congestion).    Historical Provider, MD  predniSONE (DELTASONE) 50 MG tablet 1 tablet PO daily 05/31/14   Glynn Octave, MD   BP 132/74 mmHg  Pulse 125  Temp(Src) 98.7 F (37.1 C) (Oral)  Resp 20  Ht  (1.651 m)  Wt 270 lb (122.471 kg)  BMI 44.93 kg/m2  SpO2 96% Physical Exam  Constitutional: He is oriented to person, place, and time. He appears well-developed and well-nourished. No distress.  HENT:  Head: Normocephalic and atraumatic.  Eyes: Conjunctivae and EOM are normal.  Neck: Normal range of motion.  Cardiovascular: Regular rhythm, normal heart sounds and intact distal pulses.  Tachycardia present.  Exam reveals no gallop and no friction rub.   No murmur heard. Pulmonary/Chest: Effort normal. No respiratory distress. He has wheezes (occasional end expiratory). He has no rales.  Abdominal: Soft. He exhibits no distension. There is no tenderness. There is  no guarding.  Musculoskeletal: He exhibits no edema.  Neurological: He is alert and oriented to person, place, and time.  Skin: Skin is warm and dry. He is not diaphoretic.  Nursing note and vitals reviewed.   ED Course  Procedures (including critical care time) Labs Review Labs Reviewed  CBC WITH DIFFERENTIAL/PLATELET  COMPREHENSIVE METABOLIC PANEL  I-STAT CG4 LACTIC ACID, ED    Imaging  Review Dg Chest 2 View  03/15/2015  CLINICAL DATA:  Cough, congestion, shortness of breath and right lower chest pain. EXAM: CHEST  2 VIEW COMPARISON:  Chest x-ray dated 05/31/2014. FINDINGS: Study is somewhat hypoinspiratory with low lung volumes and associated crowding of the perihilar bronchovascular markings. Given the low lung volumes, lungs appear clear. No evidence of pneumonia. No pleural effusion. No pneumothorax. Cardiomediastinal silhouette remains normal in size and configuration. No osseous abnormality. IMPRESSION: Low lung volumes.  No evidence of acute cardiopulmonary abnormality. Electronically Signed   By: Bary RichardStan  Maynard M.D.   On: 03/15/2015 15:17   I have personally reviewed and evaluated these images and lab results as part of my medical decision-making.   EKG Interpretation None      HR 102 BR 182 QRS 98 QTC 435 Sinus tacycardia No significant change from prior MDM   Final diagnoses:  None   29yo male with history of asthma presents with concern of cough for 2 weeks with development of right lower chest/right upper quadrant pain for 2 days.  DDx for pain includes cholecystitis, PE, pneumonia, pancreatitis, strained muscle.  XR limited however does not show evidence of pneumonia.  Given tachycardia, pleuritic pain, ddimer was ordered. Pt is low risk Wells (1.5) and ddimer was negative and have low suspicion for PE.  RUQ US performed given RUQ tenderness on exam to evaluate for cholecystitis and was limited, however does not show signs to suggest cholecystitis, and does show hepatic steatosis and recommend PCP follow up. Bilirubin very mildly elevated to 1.5 however other CMP values WNL.  Patient with likely viral syndrome and right sided pain likely secondary to muscular strain.  Wrote naproxen for pain and discussed supportive care. Patient discharged in stable condition with understanding of reasons to return.    Alvira MondayErin Danahi Reddish, MD 03/16/15 1225

## 2015-03-15 NOTE — ED Notes (Addendum)
Pt reports cough x2 weeks. Pt reports intermittent fevers, n/v/d, right rib pain for last several days. Pt denies any known injury. nad noted.

## 2019-02-06 ENCOUNTER — Other Ambulatory Visit: Payer: Self-pay

## 2019-02-06 ENCOUNTER — Emergency Department (HOSPITAL_COMMUNITY)
Admission: EM | Admit: 2019-02-06 | Discharge: 2019-02-06 | Disposition: A | Discharge status: Home / Self Care | Payer: BLUE CROSS/BLUE SHIELD | Attending: Emergency Medicine | Admitting: Emergency Medicine

## 2019-02-06 ENCOUNTER — Encounter (HOSPITAL_COMMUNITY): Payer: Self-pay | Admitting: Emergency Medicine

## 2019-02-06 ENCOUNTER — Emergency Department (HOSPITAL_COMMUNITY): Payer: BLUE CROSS/BLUE SHIELD

## 2019-02-06 DIAGNOSIS — R0602 Shortness of breath: Secondary | ICD-10-CM | POA: Insufficient documentation

## 2019-02-06 DIAGNOSIS — F17210 Nicotine dependence, cigarettes, uncomplicated: Secondary | ICD-10-CM | POA: Insufficient documentation

## 2019-02-06 DIAGNOSIS — R0789 Other chest pain: Secondary | ICD-10-CM

## 2019-02-06 LAB — TROPONIN I (HIGH SENSITIVITY)
Troponin I (High Sensitivity): 3 ng/L (ref <18)
Troponin I (High Sensitivity): 3 ng/L (ref <18)

## 2019-02-06 LAB — BASIC METABOLIC PANEL
Anion gap: 9 (ref 5–15)
BUN: 15 mg/dL (ref 6–20)
CO2: 28 mmol/L (ref 22–32)
Calcium: 9.2 mg/dL (ref 8.9–10.3)
Chloride: 103 mmol/L (ref 98–111)
Creatinine, Ser: 0.91 mg/dL (ref 0.61–1.24)
GFR calc Af Amer: >60 mL/min (ref >60)
GFR calc non Af Amer: >60 mL/min (ref >60)
Glucose, Bld: 110 mg/dL — ABNORMAL HIGH (ref 70–99)
Potassium: 3.9 mmol/L (ref 3.5–5.1)
Sodium: 140 mmol/L (ref 135–145)

## 2019-02-06 LAB — CBC
HCT: 46.2 % (ref 39.0–52.0)
Hemoglobin: 15.8 g/dL (ref 13.0–17.0)
MCH: 32.2 pg (ref 26.0–34.0)
MCHC: 34.2 g/dL (ref 30.0–36.0)
MCV: 94.3 fL (ref 80.0–100.0)
Platelets: 238 10*3/uL (ref 150–400)
RBC: 4.9 MIL/uL (ref 4.22–5.81)
RDW: 12.7 % (ref 11.5–15.5)
WBC: 9.4 10*3/uL (ref 4.0–10.5)
nRBC: 0 % (ref 0.0–0.2)

## 2019-02-06 LAB — D-DIMER, QUANTITATIVE (NOT AT ARMC): D-Dimer, Quant: <0.27 ug/mL-FEU (ref 0.00–0.50)

## 2019-02-06 MED ORDER — SODIUM CHLORIDE 0.9% FLUSH: 3.0000 mL | Freq: Once | INTRAVENOUS | Status: DC

## 2019-02-06 MED ORDER — IBUPROFEN 800 MG PO TABS: 800.0000 mg | ORAL_TABLET | Freq: Three times a day (TID) | ORAL | 0 refills | Status: DC | PRN

## 2019-02-06 NOTE — ED Provider Notes (Signed)
[facility] EMERGENCY DEPARTMENT Provider Note   CSN: [id number] Arrival date & time: 02/06/19  0847     History   Chief Complaint Chief Complaint  Patient presents with  . Chest Pain  . Shortness of Breath    HPI Ronald Mitchell is a 33 y.o. male.     HPI Patient presents to the emergency department with right-sided chest discomfort that started yesterday.  The patient states the pain seemed to get worse and was in the right side of his chest and shoulder.  The patient states that that was a pressure type sensation.  Patient states that he has never had a sepsis symptoms in the past.  Patient states he did not take any medications prior to arrival for symptoms.  The patient denies chest pain, shortness of breath, headache,blurred vision, neck pain, fever, cough, weakness, numbness, dizziness, anorexia, edema, abdominal pain, nausea, vomiting, diarrhea, rash, back pain, dysuria, hematemesis, bloody stool, near syncope, or syncope. Past Medical History:  Diagnosis Date  . Arthritis   . Asthma     There are no active problems to display for this patient.   Past Surgical History:  Procedure Laterality Date  . HAND SURGERY          Home Medications    Prior to Admission medications   Medication Sig Start Date End Date Taking? Authorizing Provider  aspirin EC 81 MG tablet Take 324 mg by mouth as needed.   Yes [provider]  ibuprofen (ADVIL) 800 MG tablet Take 1 tablet (800 mg total) by mouth every 8 (eight) hours as needed. 02/06/19   Dalia Heading, PA-C    Family History History reviewed. No pertinent family history.  Social History Social History   Tobacco Use  . Smoking status: Current Every Day Smoker    Packs/day: 0.50    Types: Cigarettes  . Smokeless tobacco: Never Used  Substance Use Topics  . Alcohol use: No  . Drug use: No     Allergies   Sulfa antibiotics and Yellow jacket venom [bee venom]   Review of  Systems Review of Systems   Physical Exam Updated Vital Signs BP 121/69 (BP Location: Right Arm)   Pulse 78   Temp 99 F (37.2 C) (Oral)   Resp 16   Ht 5\' 6"  (1.676 m)   Wt 122.5 kg   SpO2 97%   BMI 43.58 kg/m   Physical Exam   ED Treatments / Results  Labs (all labs ordered are listed, but only abnormal results are displayed) Labs Reviewed  BASIC METABOLIC PANEL - Abnormal; Notable for the following components:      Result Value   Glucose, Bld 110 (*)    All other components within normal limits  CBC  D-DIMER, QUANTITATIVE (NOT AT [facility])  TROPONIN I (HIGH SENSITIVITY)  TROPONIN I (HIGH SENSITIVITY)    EKG EKG Interpretation  Date/Time:  Thursday February 06 2019 08:58:50 EDT Ventricular Rate:  93 PR Interval:  172 QRS Duration: 88 QT Interval:  344 QTC Calculation: 427 R Axis:   107 Text Interpretation:  Normal sinus rhythm Rightward axis Borderline ECG No significant change since last tracing Confirmed by Isla Pence [phone]) on 02/06/2019 10:09:11 AM   Radiology Dg Chest 2 View  Result Date: 02/06/2019 CLINICAL DATA:  Cough and congestion EXAM: CHEST - 2 VIEW COMPARISON:  March 15, 2015 FINDINGS: The heart size and mediastinal contours are within normal limits. Both lungs are clear. The visualized skeletal structures  are unremarkable. IMPRESSION: No active cardiopulmonary disease. Electronically Signed   By: Sherian ReinWei-Chen  Lin M.D.   On: 02/06/2019 10:05    Procedures Procedures (including critical care time)  Medications Ordered in ED Medications  sodium chloride flush (NS) 0.9 % injection 3 mL (3 mLs Intravenous Not Given 02/06/19 1111)     Initial Impression / Assessment and Plan / ED Course  I have reviewed the triage vital signs and the nursing notes.  Pertinent labs & imaging results that were available during my care of the patient were reviewed by me and considered in my medical decision making (see chart for details).        Patient has  negative enzymes and d-dimer.  I feel that the patient's pain is chest wall in nature.  I have advised the patient to return here for any worsening his condition.  Patient seems low risk for cardiac disease other than smoking.  Final Clinical Impressions(s) / ED Diagnoses   Final diagnoses:  Atypical chest pain  Chest wall pain    ED Discharge Orders         Ordered    ibuprofen (ADVIL) 800 MG tablet  Every 8 hours PRN     02/06/19 1305           Charlestine NightLawyer, Royalty Fakhouri, PA-C 02/06/19 1549    Jacalyn LefevreHaviland, Julie, MD 02/07/19 [phone]

## 2019-02-06 NOTE — ED Notes (Signed)
Patient verbalizes understanding of discharge instructions. Opportunity for questioning and answers were provided. Armband removed by staff, pt discharged from ED.  

## 2019-02-06 NOTE — Discharge Instructions (Signed)
Return here as needed.  Follow-up with the clinic provided.  °

## 2020-07-18 IMAGING — CR DG CHEST 2V
2 series · 2 of 2 positions shown · non-contrast
Comparison: March 15, 2015

CLINICAL DATA: Cough and congestion

EXAM:
CHEST - 2 VIEW

[chest pa]
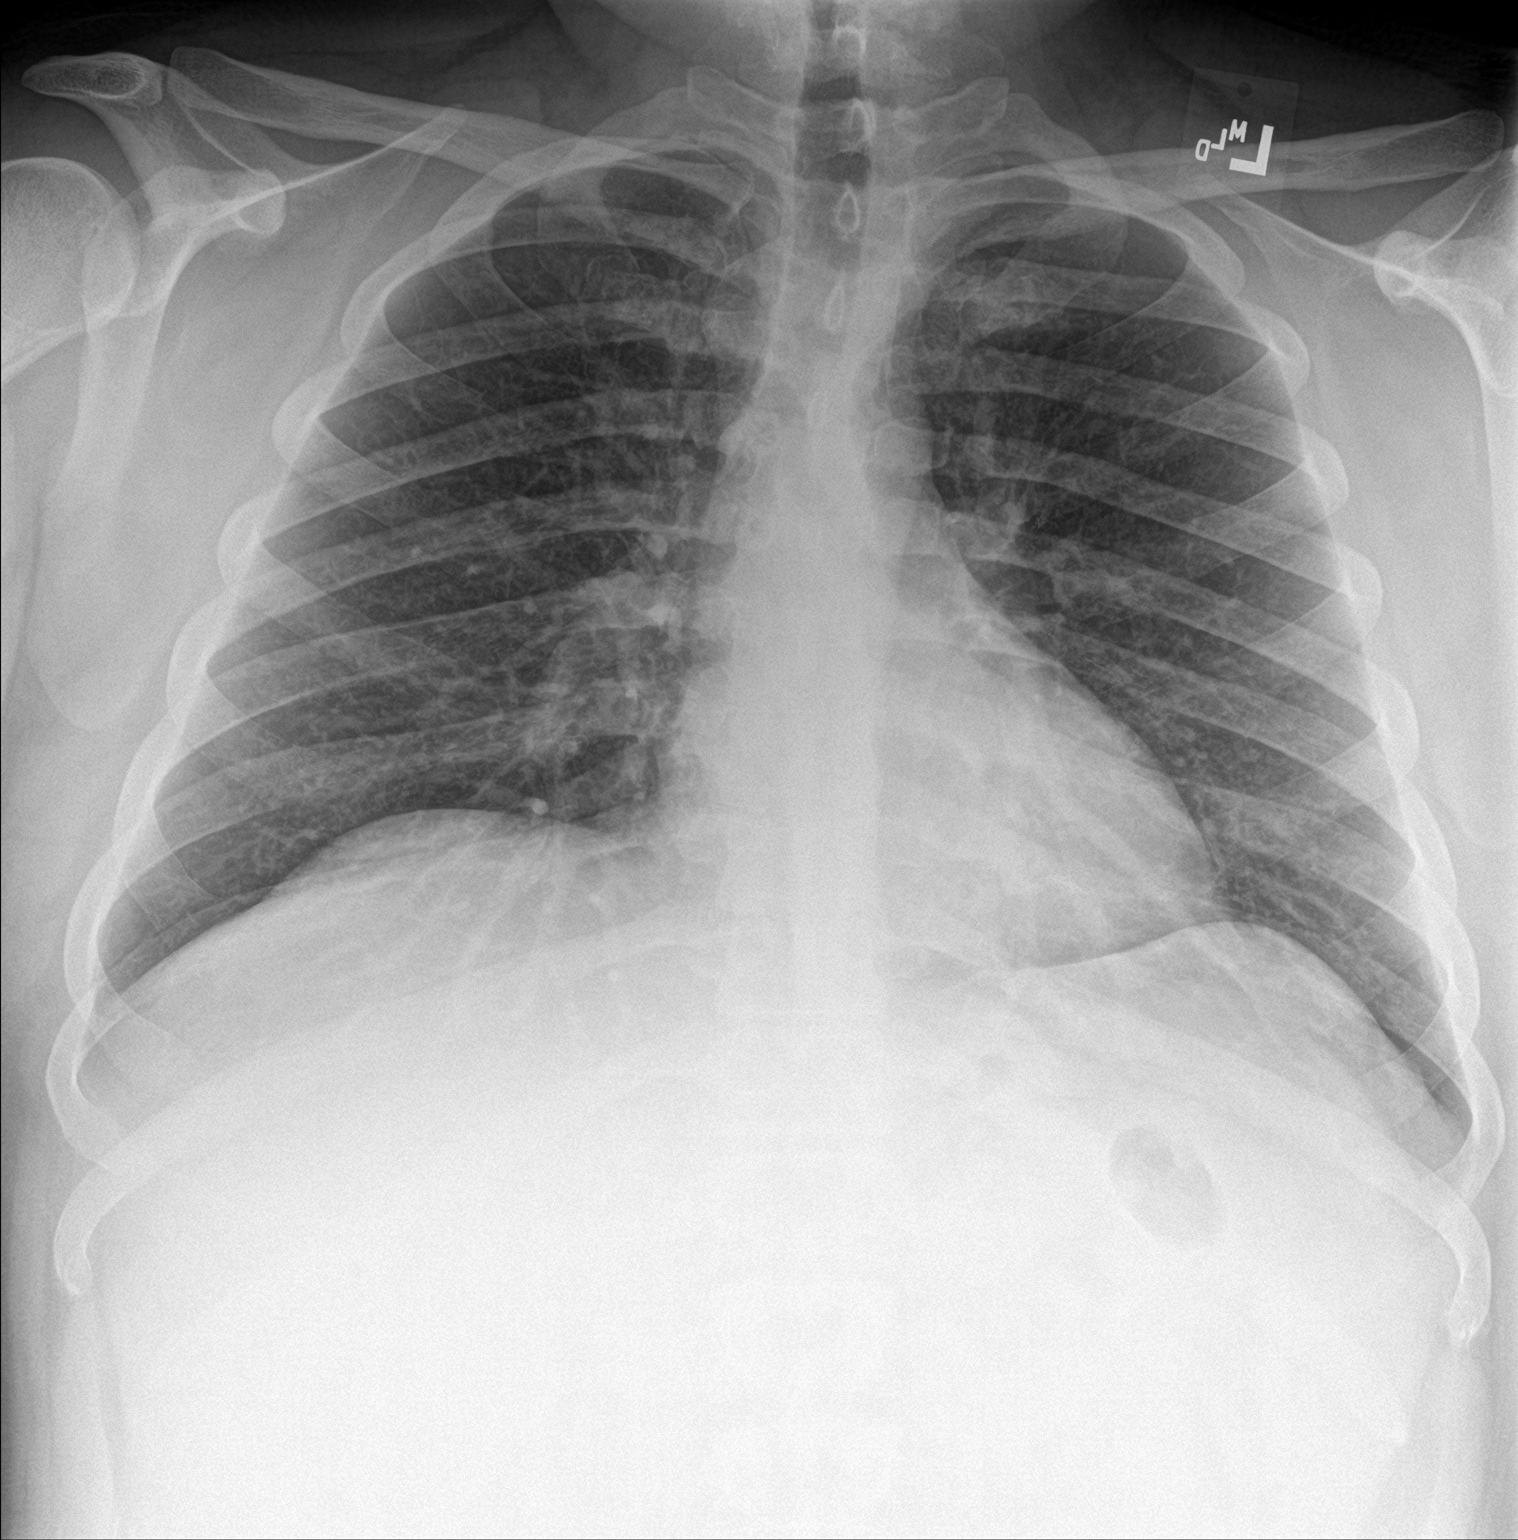

[chest lat]
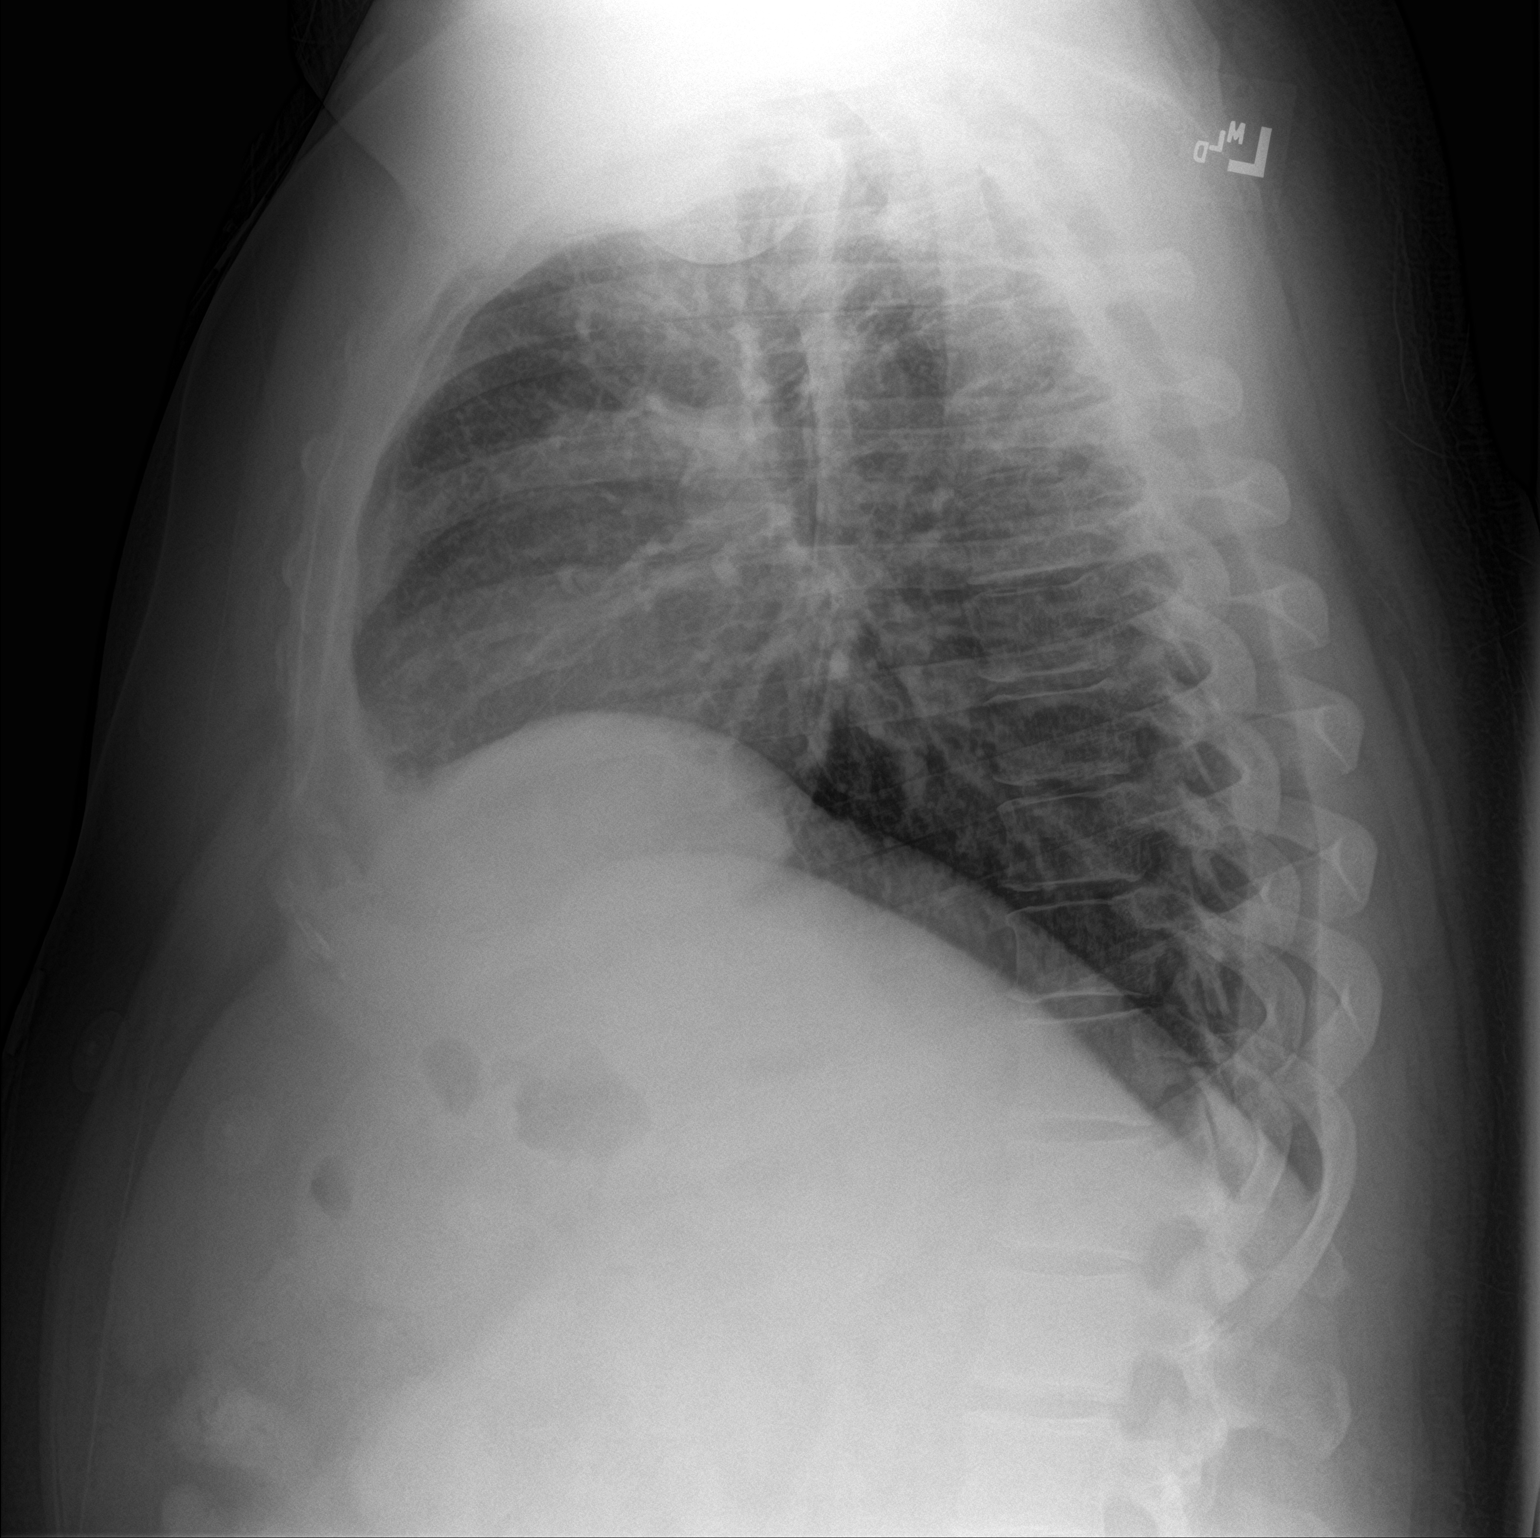

[2 of 2 positions shown; findings below may reference images not displayed]

FINDINGS: The heart size and mediastinal contours are within normal limits.
Both lungs are clear. The visualized skeletal structures are
unremarkable.
IMPRESSION: No active cardiopulmonary disease.

## 2021-06-12 ENCOUNTER — Other Ambulatory Visit: Payer: Self-pay

## 2021-06-12 ENCOUNTER — Encounter (HOSPITAL_COMMUNITY): Payer: Self-pay

## 2021-06-12 ENCOUNTER — Emergency Department (HOSPITAL_COMMUNITY)
Admission: EM | Admit: 2021-06-12 | Discharge: 2021-06-12 | Disposition: A | Discharge status: Home / Self Care | Payer: Self-pay | Attending: Emergency Medicine | Admitting: Emergency Medicine

## 2021-06-12 DIAGNOSIS — R748 Abnormal levels of other serum enzymes: Secondary | ICD-10-CM | POA: Insufficient documentation

## 2021-06-12 DIAGNOSIS — M109 Gout, unspecified: Secondary | ICD-10-CM | POA: Insufficient documentation

## 2021-06-12 DIAGNOSIS — R7309 Other abnormal glucose: Secondary | ICD-10-CM | POA: Insufficient documentation

## 2021-06-12 DIAGNOSIS — Z7982 Long term (current) use of aspirin: Secondary | ICD-10-CM | POA: Insufficient documentation

## 2021-06-12 DIAGNOSIS — J45909 Unspecified asthma, uncomplicated: Secondary | ICD-10-CM | POA: Insufficient documentation

## 2021-06-12 DIAGNOSIS — R7401 Elevation of levels of liver transaminase levels: Secondary | ICD-10-CM

## 2021-06-12 HISTORY — DX: Gout, unspecified: M10.9

## 2021-06-12 LAB — COMPREHENSIVE METABOLIC PANEL
ALT: 54 U/L — ABNORMAL HIGH (ref 0–44)
AST: 25 U/L (ref 15–41)
Albumin: 3.8 g/dL (ref 3.5–5.0)
Alkaline Phosphatase: 52 U/L (ref 38–126)
Anion gap: 8 (ref 5–15)
BUN: 16 mg/dL (ref 6–20)
CO2: 27 mmol/L (ref 22–32)
Calcium: 8.9 mg/dL (ref 8.9–10.3)
Chloride: 100 mmol/L (ref 98–111)
Creatinine, Ser: 0.86 mg/dL (ref 0.61–1.24)
GFR, Estimated: >60 mL/min (ref >60)
Glucose, Bld: 135 mg/dL — ABNORMAL HIGH (ref 70–99)
Potassium: 4.5 mmol/L (ref 3.5–5.1)
Sodium: 135 mmol/L (ref 135–145)
Total Bilirubin: 0.8 mg/dL (ref 0.3–1.2)
Total Protein: 8.1 g/dL (ref 6.5–8.1)

## 2021-06-12 LAB — CBC WITH DIFFERENTIAL/PLATELET
Abs Immature Granulocytes: 0.09 10*3/uL — ABNORMAL HIGH (ref 0.00–0.07)
Basophils Absolute: 0.1 10*3/uL (ref 0.0–0.1)
Basophils Relative: 1 %
Eosinophils Absolute: 0.1 10*3/uL (ref 0.0–0.5)
Eosinophils Relative: 1 %
HCT: 40.8 % (ref 39.0–52.0)
Hemoglobin: 13.1 g/dL (ref 13.0–17.0)
Immature Granulocytes: 1 %
Lymphocytes Relative: 13 %
Lymphs Abs: 1.6 10*3/uL (ref 0.7–4.0)
MCH: 30.2 pg (ref 26.0–34.0)
MCHC: 32.1 g/dL (ref 30.0–36.0)
MCV: 94 fL (ref 80.0–100.0)
Monocytes Absolute: 1.1 10*3/uL — ABNORMAL HIGH (ref 0.1–1.0)
Monocytes Relative: 9 %
Neutro Abs: 9.6 10*3/uL — ABNORMAL HIGH (ref 1.7–7.7)
Neutrophils Relative %: 75 %
Platelets: 311 10*3/uL (ref 150–400)
RBC: 4.34 MIL/uL (ref 4.22–5.81)
RDW: 12.9 % (ref 11.5–15.5)
WBC: 12.7 10*3/uL — ABNORMAL HIGH (ref 4.0–10.5)
nRBC: 0 % (ref 0.0–0.2)

## 2021-06-12 LAB — URIC ACID: Uric Acid, Serum: 7.8 mg/dL (ref 3.7–8.6)

## 2021-06-12 LAB — SEDIMENTATION RATE: Sed Rate: 80 mm/hr — ABNORMAL HIGH (ref 0–16)

## 2021-06-12 MED ORDER — PREDNISONE 50 MG PO TABS
60.0000 mg | ORAL_TABLET | Freq: Once | ORAL | Status: AC
  Administered 2021-06-12: 60 mg via ORAL
  Filled 2021-06-12: qty 1

## 2021-06-12 MED ORDER — PREDNISONE 20 MG PO TABS: 60.0000 mg | ORAL_TABLET | Freq: Every day | ORAL | 0 refills | Status: DC

## 2021-06-12 MED ORDER — OXYCODONE-ACETAMINOPHEN 5-325 MG PO TABS: 1.0000 | ORAL_TABLET | ORAL | 0 refills | Status: DC | PRN

## 2021-06-12 MED ORDER — OXYCODONE-ACETAMINOPHEN 5-325 MG PO TABS
1.0000 | ORAL_TABLET | ORAL | 0 refills | Status: DC | PRN
Start: 2021-06-12 — End: 2024-03-04

## 2021-06-12 MED ORDER — OXYCODONE-ACETAMINOPHEN 5-325 MG PO TABS
1.0000 | ORAL_TABLET | Freq: Once | ORAL | Status: AC
  Administered 2021-06-12: 1 via ORAL
  Filled 2021-06-12: qty 1

## 2021-06-12 NOTE — ED Triage Notes (Addendum)
Pt reports left knee pain and bilateral ankle pain that started about a week ago and has gotten worse. Pt denies injury. Bilateral big toe pain, worse on left.

## 2021-06-12 NOTE — Discharge Instructions (Addendum)
Your blood sugar is in the range that is called prediabetes.  This will need to be monitored so that if you go into the diabetes range, you can have treatment started early.

## 2021-06-12 NOTE — ED Provider Notes (Signed)
[facility] EMERGENCY DEPARTMENT Provider Note   CSN: [id number] Arrival date & time: 06/12/21  1696     History  Chief Complaint  Patient presents with   Leg Pain    Bilateral ankle pain and left knee pain x 1 week    Ronald Mitchell is a 36 y.o. male.  The history is provided by the patient.  Leg Pain He has history of asthma, gout and comes in complaining of pain in his left knee, both ankles, and left great toe for the last week, getting worse.  Symptoms started at about that time he completed a course of levofloxacin for pneumonia.  Pain is severe and he rates it at 10/10.  It is worse with palpation and worse with ambulation.  He had been taking over-the-counter naproxen which initially was giving slight relief but now is not helping at all.  He has had similar pain in his ankle in the past, but not this severe and never in multiple joints.  He denies fever or chills and denies any chest pain or dyspnea.  He denies any trauma or unusual activity.   Home Medications Prior to Admission medications   Medication Sig Start Date End Date Taking? Authorizing Provider  aspirin EC 81 MG tablet Take 324 mg by mouth as needed.    [provider]  ibuprofen (ADVIL) 800 MG tablet Take 1 tablet (800 mg total) by mouth every 8 (eight) hours as needed. 02/06/19   Lawyer, Cristal Deer, PA-C      Allergies    Sulfa antibiotics and Yellow jacket venom [bee venom]    Review of Systems   Review of Systems  All other systems reviewed and are negative.  Physical Exam Updated Vital Signs BP 112/74    Pulse 86    Temp 97.8 F (36.6 C) (Oral)    Resp 17    Ht 5\' 6"  (1.676 m)    Wt 127 kg    SpO2 98%    BMI 45.19 kg/m  Physical Exam Vitals and nursing note reviewed.  36 year old male, resting comfortably and in no acute distress. Vital signs are normal. Oxygen saturation is 98%, which is normal. Head is normocephalic and atraumatic. PERRLA, EOMI. Oropharynx is clear. Neck is  nontender and supple without adenopathy or JVD. Back is nontender and there is no CVA tenderness. Lungs are clear without rales, wheezes, or rhonchi. Chest is nontender. Heart has regular rate and rhythm without murmur. Abdomen is soft, flat, nontender. Extremities: There is soft tissue swelling around the left knee, both ankles, left first toe with marked tenderness to palpation in all areas.  There is mild warmth but no erythema. Skin is warm and dry without rash. Neurologic: Mental status is normal, cranial nerves are intact, moves all extremities equally.  ED Results / Procedures / Treatments   Labs (all labs ordered are listed, but only abnormal results are displayed) Labs Reviewed  COMPREHENSIVE METABOLIC PANEL - Abnormal; Notable for the following components:      Result Value   Glucose, Bld 135 (*)    ALT 54 (*)    All other components within normal limits  CBC WITH DIFFERENTIAL/PLATELET - Abnormal; Notable for the following components:   WBC 12.7 (*)    Neutro Abs 9.6 (*)    Monocytes Absolute 1.1 (*)    Abs Immature Granulocytes 0.09 (*)    All other components within normal limits  URIC ACID  SEDIMENTATION RATE   Procedures Procedures  Medications Ordered in ED Medications  oxyCODONE-acetaminophen (PERCOCET/ROXICET) 5-325 MG per tablet 1 tablet (1 tablet Oral Given 06/12/21 0644)  predniSONE (DELTASONE) tablet 60 mg (60 mg Oral Given 06/12/21 0745)    ED Course/ Medical Decision Making/ A&P                           Medical Decision Making Amount and/or Complexity of Data Reviewed Labs: ordered.  Risk Prescription drug management.   Polyarticular arthritis involving left knee, both ankles, left great toe.  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  Patient is concerned that it could be a side effect from levofloxacin, but this is very unlikely given the timing.  Also, I could find no reports of polyarthritis secondary to levofloxacin.  Old records  reviewed confirming recent hospitalization for pneumonia with possible sepsis.  I could find no record of a uric acid on file.  At this point, we will check screening labs including uric acid and give oxycodone-acetaminophen for pain.  Unless a contraindication is identified, anticipate sending him home with a course of prednisone.    Uric acid has come back at the high end of normal, still consistent with polyarticular gout.  Glucose is noted to be in the prediabetic range, patient is advised of this.  Mild elevation of ALT is noted, not felt to be clinically significant.  He feels better after a dose of oxycodone-acetaminophen, he is discharged with prescriptions for prednisone and oxycodone-acetaminophen.        Final Clinical Impression(s) / ED Diagnoses Final diagnoses:  Polyarticular gout  Elevated ALT measurement  Elevated random blood glucose level    Rx / DC Orders ED Discharge Orders          Ordered    predniSONE (DELTASONE) 20 MG tablet  Daily        06/12/21 0746    oxyCODONE-acetaminophen (PERCOCET) 5-325 MG tablet  Every 4 hours PRN        06/12/21 0746    oxyCODONE-acetaminophen (PERCOCET) 5-325 MG tablet  Every 4 hours PRN        06/12/21 0746              Dione Booze, MD 06/12/21 [phone]

## 2021-07-27 ENCOUNTER — Emergency Department (HOSPITAL_COMMUNITY)
Admission: EM | Admit: 2021-07-27 | Discharge: 2021-07-27 | Disposition: A | Discharge status: Home / Self Care | Payer: Self-pay | Attending: Emergency Medicine | Admitting: Emergency Medicine

## 2021-07-27 ENCOUNTER — Encounter (HOSPITAL_COMMUNITY): Payer: Self-pay

## 2021-07-27 ENCOUNTER — Other Ambulatory Visit: Payer: Self-pay

## 2021-07-27 DIAGNOSIS — M109 Gout, unspecified: Secondary | ICD-10-CM | POA: Insufficient documentation

## 2021-07-27 DIAGNOSIS — Z7982 Long term (current) use of aspirin: Secondary | ICD-10-CM | POA: Insufficient documentation

## 2021-07-27 MED ORDER — PREDNISONE 20 MG PO TABS
40.0000 mg | ORAL_TABLET | Freq: Once | ORAL | Status: AC
  Administered 2021-07-27: 40 mg via ORAL
  Filled 2021-07-27: qty 2

## 2021-07-27 MED ORDER — INDOMETHACIN 50 MG PO CAPS: 50.0000 mg | ORAL_CAPSULE | Freq: Three times a day (TID) | ORAL | 0 refills | Status: AC | PRN

## 2021-07-27 MED ORDER — INDOMETHACIN 25 MG PO CAPS
50.0000 mg | ORAL_CAPSULE | Freq: Once | ORAL | Status: AC
  Administered 2021-07-27: 50 mg via ORAL
  Filled 2021-07-27: qty 2

## 2021-07-27 MED ORDER — PREDNISONE 20 MG PO TABS: 40.0000 mg | ORAL_TABLET | Freq: Every day | ORAL | 0 refills | Status: AC

## 2021-07-27 NOTE — ED Provider Notes (Signed)
?[facility] EMERGENCY DEPARTMENT ?Provider Note ? ? ?CSN: [id number] ?Arrival date & time: 07/27/21  0848 ? ?  ? ?History ? ?Chief Complaint  ?Patient presents with  ? Ankle Pain  ? ? ?Ronald Mitchell is a 36 y.o. male with a past medical history of gout presenting today with a flare of his gout.  Reports that last night he started to have swelling and pain to his right ankle.  It worsened this morning.  He reports that last time when he had a flareup it spread to his left ankle and bilateral knees and that he had to have fluid drained so he decided to catch this 1 early.  Denies drinking alcohol or eating foods with high levels of purines.  Is unsure what flares of his gout.  Denies any numbness or tingling.  Has not felt warmth to the area.  Says that any movement increases his pain which he rates at a 7/10.  Has been taking naproxen and it helps a little bit. ? ? ?  ? ?Home Medications ?Prior to Admission medications   ?Medication Sig Start Date End Date Taking? Authorizing Provider  ?indomethacin (INDOCIN) 50 MG capsule Take 1 capsule (50 mg total) by mouth 3 (three) times daily as needed for up to 5 days. 07/27/21 08/01/21 Yes Osiris Odriscoll A, PA-C  ?predniSONE (DELTASONE) 20 MG tablet Take 2 tablets (40 mg total) by mouth daily for 6 days. 07/27/21 08/02/21 Yes Sterling Mondo A, PA-C  ?aspirin EC 81 MG tablet Take 324 mg by mouth as needed.    [provider]  ?ibuprofen (ADVIL) 800 MG tablet Take 1 tablet (800 mg total) by mouth every 8 (eight) hours as needed. 02/06/19   Lawyer, Cristal Deer, PA-C  ?oxyCODONE-acetaminophen (PERCOCET) 5-325 MG tablet Take 1 tablet by mouth every 4 (four) hours as needed for moderate pain. 06/12/21   Dione Booze, MD  ?oxyCODONE-acetaminophen (PERCOCET) 5-325 MG tablet Take 1 tablet by mouth every 4 (four) hours as needed for moderate pain. 06/12/21   Dione Booze, MD  ?   ? ?Allergies    ?Sulfa antibiotics and Yellow jacket venom [bee venom]   ? ?Review of Systems    ?Review of Systems ?HPI ? ?Physical Exam ?Updated Vital Signs ?BP (!) 142/89 (BP Location: Left Arm)   Pulse (!) 110   Temp 98.3 ?F (36.8 ?C) (Oral)   Resp 20   Ht 5\' 6"  (1.676 m)   Wt 131.5 kg   SpO2 96%   BMI 46.81 kg/m?  ?Physical Exam ?Vitals and nursing note reviewed.  ?Constitutional:   ?   Appearance: Normal appearance.  ?HENT:  ?   Head: Normocephalic and atraumatic.  ?Eyes:  ?   General: No scleral icterus. ?   Conjunctiva/sclera: Conjunctivae normal.  ?Pulmonary:  ?   Effort: Pulmonary effort is normal. No respiratory distress.  ?Musculoskeletal:  ?   Comments: Full range of motion of right ankle.  Strong DP pulse.  No overlying cellulitic skin.  Inflammation to the ankle joint, most on the lateral ankle.  ?Skin: ?   Findings: No rash.  ?Neurological:  ?   Mental Status: He is alert.  ?Psychiatric:     ?   Mood and Affect: Mood normal.  ? ? ?ED Results / Procedures / Treatments   ?Labs ?(all labs ordered are listed, but only abnormal results are displayed) ?Labs Reviewed - No data to display ? ?EKG ?None ? ?Radiology ?No results found. ? ?Procedures ?Procedures  ? ?  Medications Ordered in ED ?Medications  ?predniSONE (DELTASONE) tablet 40 mg (has no administration in time range)  ?indomethacin (INDOCIN) capsule 50 mg (has no administration in time range)  ? ? ?ED Course/ Medical Decision Making/ A&P ?  ?                        ?Medical Decision Making ?Risk ?Prescription drug management. ? ? ?36 year old male with a past medical history of gout presenting for what he believes is a flare.  Reporting right ankle pain.  Denies any injury or trauma to the ankle.  Says that this started last night and naproxen somewhat helped some. ? ?Physical exam: Patient with inflammation without obvious effusion or infection of the right ankle.  Consistent with gout.  Neurovascularly intact. ? ?Treatment: He was given the first dose of indomethacin and prednisone in the department.  Remaining courses of these  medications are at his pharmacy.  He understands that he should not take ibuprofen or naproxen with the indomethacin.  Side effects of prednisone discussed.  He was given information about common causes of gout flares and discharged in an ambulatory in stable condition. ? ?Final Clinical Impression(s) / ED Diagnoses ?Final diagnoses:  ?Acute gout of right ankle, unspecified cause  ? ? ?Rx / DC Orders ?ED Discharge Orders   ? ?      Ordered  ?  predniSONE (DELTASONE) 20 MG tablet  Daily       ? 07/27/21 0917  ?  indomethacin (INDOCIN) 50 MG capsule  3 times daily PRN       ? 07/27/21 8921  ? ?  ?  ? ?  ? ?Results and diagnoses were explained to the patient. Return precautions discussed in full. Patient had no additional questions and expressed complete understanding. ? ? ?This chart was dictated using voice recognition software.  Despite best efforts to proofread,  errors can occur which can change the documentation meaning.  ?  Saddie Benders, PA-C ?07/27/21 1941 ? ?  ?Jacalyn Lefevre, MD ?07/27/21 1126 ? ?

## 2021-07-27 NOTE — ED Triage Notes (Signed)
Patient with complaints of right ankle pain and swelling with history of gout that started the previous night.  ?

## 2021-07-27 NOTE — Discharge Instructions (Addendum)
For the next 5 days you will take the indomethacin 3 times a day.  You have been given 1 today, you may take the next 1 in the afternoon.  This medication should help with pain, do not take it with naproxen or ibuprofen.  You may also take Tylenol.   ? ?For the next 7 days you will take prednisone.  This is a daily medication.  You have been given the first dose today.  There are enough for 6 more days, you may take the next dose in the morning. ? ?Read the information about gout attached to these discharge papers to learn about common causes of flares. ?

## 2021-08-19 ENCOUNTER — Encounter (HOSPITAL_COMMUNITY): Payer: Self-pay | Admitting: Emergency Medicine

## 2021-08-19 ENCOUNTER — Emergency Department (HOSPITAL_COMMUNITY)
Admission: EM | Admit: 2021-08-19 | Discharge: 2021-08-19 | Disposition: A | Discharge status: Home / Self Care | Payer: Self-pay | Attending: Emergency Medicine | Admitting: Emergency Medicine

## 2021-08-19 ENCOUNTER — Other Ambulatory Visit: Payer: Self-pay

## 2021-08-19 DIAGNOSIS — L02414 Cutaneous abscess of left upper limb: Secondary | ICD-10-CM | POA: Insufficient documentation

## 2021-08-19 DIAGNOSIS — Z23 Encounter for immunization: Secondary | ICD-10-CM | POA: Insufficient documentation

## 2021-08-19 DIAGNOSIS — L03114 Cellulitis of left upper limb: Secondary | ICD-10-CM

## 2021-08-19 DIAGNOSIS — R000 Tachycardia, unspecified: Secondary | ICD-10-CM | POA: Insufficient documentation

## 2021-08-19 MED ORDER — TETANUS-DIPHTH-ACELL PERTUSSIS 5-2.5-18.5 LF-MCG/0.5 IM SUSY
0.5000 mL | PREFILLED_SYRINGE | Freq: Once | INTRAMUSCULAR | Status: AC
  Administered 2021-08-19: 0.5 mL via INTRAMUSCULAR
  Filled 2021-08-19: qty 0.5

## 2021-08-19 MED ORDER — DOXYCYCLINE HYCLATE 100 MG PO CAPS: 100.0000 mg | ORAL_CAPSULE | Freq: Two times a day (BID) | ORAL | 0 refills | Status: DC

## 2021-08-19 MED ORDER — LIDOCAINE HCL (PF) 1 % IJ SOLN
5.0000 mL | Freq: Once | INTRAMUSCULAR | Status: AC
  Administered 2021-08-19: 5 mL
  Filled 2021-08-19: qty 5

## 2021-08-19 NOTE — Discharge Instructions (Addendum)
Take doxycycline twice daily for the next 10 days.  May take 48 hours for the antibiotics to start working, if the redness spreads beyond the outline we do you should return back to the hospital for wound recheck.  If you have a fever that is another reason to return back to the hospital.  Wash the wound with soap and water, try applying heat when possible. ?

## 2021-08-19 NOTE — ED Provider Notes (Signed)
?[facility] EMERGENCY DEPARTMENT ?Provider Note ? ? ?CSN: [id number] ?Arrival date & time: 08/19/21  3212 ? ?  ? ?History ? ?Chief Complaint  ?Patient presents with  ? Abscess  ? ? ?ELIOTT Mitchell is a 36 y.o. male. ? ?The history is provided by the patient and a significant other.  ?Abscess ? ?Patient is a 36 year old male with no contributable past medical history presenting today with abscess to left bicep.  It started out as a small pimple 3 days ago.  He tried popping it, states he had a small amount of purulent discharge but after that it "retaliated" with worsening pain, spreading redness and increase in size.  He is not sure if he got bit by a bug, no history of same.  Denies any history of diabetes, he is allergic to sulfa antibiotics.  Last tetanus was in 2014. ? ?Home Medications ?Prior to Admission medications   ?Medication Sig Start Date End Date Taking? Authorizing Provider  ?aspirin EC 81 MG tablet Take 324 mg by mouth as needed.    [provider]  ?ibuprofen (ADVIL) 800 MG tablet Take 1 tablet (800 mg total) by mouth every 8 (eight) hours as needed. 02/06/19   Lawyer, Cristal Deer, PA-C  ?oxyCODONE-acetaminophen (PERCOCET) 5-325 MG tablet Take 1 tablet by mouth every 4 (four) hours as needed for moderate pain. 06/12/21   Dione Booze, MD  ?oxyCODONE-acetaminophen (PERCOCET) 5-325 MG tablet Take 1 tablet by mouth every 4 (four) hours as needed for moderate pain. 06/12/21   Dione Booze, MD  ?   ? ?Allergies    ?Sulfa antibiotics and Yellow jacket venom [bee venom]   ? ?Review of Systems   ?Review of Systems ? ?Physical Exam ?Updated Vital Signs ?BP 120/78   Pulse (!) 107   Temp 98 ?F (36.7 ?C) (Oral)   Resp 20   SpO2 96%  ?Physical Exam ?Vitals and nursing note reviewed. Exam conducted with a chaperone present.  ?Constitutional:   ?   General: He is not in acute distress. ?   Appearance: Normal appearance.  ?HENT:  ?   Head: Normocephalic and atraumatic.  ?Eyes:  ?   General: No scleral  icterus. ?   Extraocular Movements: Extraocular movements intact.  ?   Pupils: Pupils are equal, round, and reactive to light.  ?Cardiovascular:  ?   Rate and Rhythm: Regular rhythm. Tachycardia present.  ?   Pulses: Normal pulses.  ?Skin: ?   Capillary Refill: Capillary refill takes less than 2 seconds.  ?   Coloration: Skin is not jaundiced.  ?   Findings: Erythema present.  ?   Comments: Abscess noted to left bicep.  Roughly 3 cm of fluctuance.  There is surrounding induration, 3 cm extending erythema and warmth consistent with cellulitis.  Please see picture.  ?Neurological:  ?   Mental Status: He is alert. Mental status is at baseline.  ?   Coordination: Coordination normal.  ? ? ? ?ED Results / Procedures / Treatments   ?Labs ?(all labs ordered are listed, but only abnormal results are displayed) ?Labs Reviewed - No data to display ? ?EKG ?None ? ?Radiology ?No results found. ? ?Procedures ?Marland Kitchen.Incision and Drainage ? ?Date/Time: 08/19/2021 10:32 AM ?Performed by: Theron Arista, PA-C ?Authorized by: Theron Arista, PA-C  ? ?Consent:  ?  Consent obtained:  Verbal ?  Consent given by:  Patient ?  Risks discussed:  Bleeding, incomplete drainage, pain and damage to other organs ?  Alternatives discussed:  No  treatment ?Universal protocol:  ?  Procedure explained and questions answered to patient or proxy's satisfaction: yes   ?  Relevant documents present and verified: yes   ?  Test results available : yes   ?  Imaging studies available: yes   ?  Required blood products, implants, devices, and special equipment available: yes   ?  Site/side marked: yes   ?  Immediately prior to procedure, a time out was called: yes   ?  Patient identity confirmed:  Verbally with patient ?Location:  ?  Type:  Abscess ?  Size:  3 ?  Location:  Upper extremity ?  Upper extremity location:  Arm ?  Arm location:  L upper arm ?Pre-procedure details:  ?  Skin preparation:  Chlorhexidine ?Anesthesia:  ?  Anesthesia method:  Local infiltration ?   Local anesthetic:  Lidocaine 1% w/o epi ?Procedure type:  ?  Complexity:  Complex ?Procedure details:  ?  Ultrasound guidance: no   ?  Needle aspiration: no   ?  Incision types:  Single straight ?  Incision depth:  Subcutaneous ?  Wound management:  Probed and deloculated, irrigated with saline and extensive cleaning ?  Drainage:  Serosanguinous ?  Drainage amount:  Moderate ?  Wound treatment:  Wound left open ?  Packing materials:  1/4 in gauze ?Post-procedure details:  ?  Procedure completion:  Tolerated well, no immediate complications  ? ? ?Medications Ordered in ED ?Medications  ?lidocaine (PF) (XYLOCAINE) 1 % injection 5 mL (has no administration in time range)  ?Tdap (BOOSTRIX) injection 0.5 mL (0.5 mLs Intramuscular Given 08/19/21 0957)  ? ? ?ED Course/ Medical Decision Making/ A&P ?  ?                        ?Medical Decision Making ?Risk ?Prescription drug management. ? ? ?This is a 36 year old male presenting with abscess/erythema to left bicep.  Differential diagnosis includes but is not limited to abscess, cellulitis, other. ? ?Patient's wife is at bedside is an independent historian.  Notably, patient's tetanus has not been updated since 2014. ? ?Patient is neurovascularly intact.  Physical exam notable for abscess and surrounding erythema concerning for cellulitis.  Patient is tachycardic at 125, he has a regular rhythm.  Denies any chest pain or shortness of breath, not having any fevers at home.  Does not appear septic. ? ?EKG ordered and viewed by myself.  Sinus tachycardia, does not appear to be in any underlying arrhythmia.  Tetanus updated, abscess drained with serosanguineous output.  Wound left open, will start patient on antibiotics with strict return precautions. ? ?Considered admission but patient is not septic and do not think he needs admission for IV antibiotics at this point.  I think close follow-up and return precautions are appropriate disposition for this  patient. ? ? ? ? ? ? ? ?Final Clinical Impression(s) / ED Diagnoses ?Final diagnoses:  ?None  ? ? ?Rx / DC Orders ?ED Discharge Orders   ? ? None  ? ?  ? ? ?  ?Theron Arista, PA-C ?08/19/21 1033 ? ?  ?Bethann Berkshire, MD ?08/20/21 1029 ? ?

## 2021-08-19 NOTE — ED Triage Notes (Signed)
Pt reports abscess to the left bicep that started 2 days. Redness and swelling noted around the area. Pt denies fevers.  ?

## 2024-03-01 ENCOUNTER — Encounter (HOSPITAL_COMMUNITY): Payer: Self-pay

## 2024-03-01 ENCOUNTER — Emergency Department (HOSPITAL_COMMUNITY)
Admission: EM | Admit: 2024-03-01 | Discharge: 2024-03-01 | Disposition: A | Discharge status: Home / Self Care | Payer: Self-pay | Attending: Emergency Medicine | Admitting: Emergency Medicine

## 2024-03-01 ENCOUNTER — Emergency Department (HOSPITAL_COMMUNITY): Payer: Self-pay

## 2024-03-01 DIAGNOSIS — L02512 Cutaneous abscess of left hand: Secondary | ICD-10-CM | POA: Insufficient documentation

## 2024-03-01 DIAGNOSIS — Z7982 Long term (current) use of aspirin: Secondary | ICD-10-CM | POA: Insufficient documentation

## 2024-03-01 MED ORDER — CEFTRIAXONE SODIUM 1 G IJ SOLR
1.0000 g | Freq: Once | INTRAMUSCULAR | Status: AC
  Administered 2024-03-01: 1 g via INTRAMUSCULAR
  Filled 2024-03-01: qty 10

## 2024-03-01 MED ORDER — DOXYCYCLINE HYCLATE 100 MG PO CAPS: 100.0000 mg | ORAL_CAPSULE | Freq: Two times a day (BID) | ORAL | 0 refills | Status: AC

## 2024-03-01 MED ORDER — LIDOCAINE HCL (PF) 1 % IJ SOLN
INTRAMUSCULAR | Status: AC
  Administered 2024-03-01: 2 mL
  Filled 2024-03-01: qty 5

## 2024-03-01 NOTE — Discharge Instructions (Signed)
Soak area 20 minutes 4 times a day.  Return if symptoms worsen or change.

## 2024-03-01 NOTE — ED Notes (Signed)
 Pt/family received d/c paperwork at this time. After going over the paperwork any questions, comments, or concerns were answered to the best of this nurse's knowledge. The pt/family verbally acknowledged the teachings/instructions.

## 2024-03-01 NOTE — ED Triage Notes (Signed)
 Pt comes in for a bug bite. Pt believes he was bittem about about a wk ago. Pt squeezed what looked like pimple and white stuff came out. Pt took left over amoxicillin he had at home. Pt noticed another whitehead formed in the area and opened it with a razor blade. When pt opened it it was a blackish green come out of it. A&Ox4.

## 2024-03-01 NOTE — ED Provider Notes (Signed)
 Ronald Mitchell   CSN: [id number] Arrival date & time: 03/01/24  1438     Patient presents with: Abscess (Left hand)   Ronald Mitchell is a 38 y.o. male.   Patient complains of a swollen abscessed area on his left hand.  Patient reports he has had some green drainage.  Patient started taking amoxicillin from an old prescription.  Patient decided to come in for evaluation because he was concerned he still has swelling.  He states it looks better than it did yesterday.  The history is provided by the patient. No language interpreter was used.  Abscess Location:  Hand Hand abscess location:  L hand Size:  4 cm Abscess quality: redness and warmth   Duration:  4 days Progression:  Worsening Chronicity:  New Relieved by:  Nothing Worsened by:  Nothing Ineffective treatments:  None tried Associated symptoms: no fever   Risk factors: no prior abscess        Prior to Admission medications   Medication Sig Start Date End Date Taking? Authorizing Provider  doxycycline  (VIBRAMYCIN ) 100 MG capsule Take 1 capsule (100 mg total) by mouth 2 (two) times daily. 03/01/24  Yes Ronald Mitchell  aspirin EC 81 MG tablet Take 324 mg by mouth as needed.    Provider, Historical, Mitchell  ibuprofen  (ADVIL ) 800 MG tablet Take 1 tablet (800 mg total) by mouth every 8 (eight) hours as needed. 02/06/19   Ronald Mitchell  oxyCODONE -acetaminophen  (PERCOCET) 5-325 MG tablet Take 1 tablet by mouth every 4 (four) hours as needed for moderate pain. 06/12/21   Ronald Mitchell  oxyCODONE -acetaminophen  (PERCOCET) 5-325 MG tablet Take 1 tablet by mouth every 4 (four) hours as needed for moderate pain. 06/12/21   Ronald Mitchell    Allergies: Sulfa antibiotics and Yellow jacket venom [bee venom]    Review of Systems  Constitutional:  Negative for fever.  All other systems reviewed and are negative.   Updated Vital Signs BP (!) 128/90    Pulse 87   Temp 98.6 F (37 C) (Oral)   Resp 16   Ht 5' 6 (1.676 m)   Wt 109.8 kg   SpO2 94%   BMI 39.06 kg/m   Physical Exam Vitals and nursing Mitchell reviewed.  Constitutional:      Appearance: He is well-developed.  HENT:     Head: Normocephalic.  Cardiovascular:     Rate and Rhythm: Normal rate.  Pulmonary:     Effort: Pulmonary effort is normal.  Abdominal:     General: There is no distension.  Musculoskeletal:        General: Normal range of motion.     Comments: 4cm red area dorsal left hand  Skin:    General: Skin is warm.  Neurological:     General: No focal deficit present.     Mental Status: He is alert and oriented to person, place, and time.     (all labs ordered are listed, but only abnormal results are displayed) Labs Reviewed - No data to display  EKG: None  Radiology: DG Hand Complete Left Result Date: 03/01/2024 CLINICAL DATA:  Spider bite with drainage. EXAM: LEFT HAND - COMPLETE 3+ VIEW COMPARISON:  None Available. FINDINGS: There is no evidence of fracture or dislocation. There is no evidence of arthropathy or other focal bone abnormality. Soft tissue swelling near the base of the thumb. IMPRESSION: No acute osseous abnormality. Soft tissue swelling  near the base of the thumb. Electronically Signed   By: Ronald Sherry M.D.   On: 03/01/2024 15:53     Procedures   Medications Ordered in the ED  cefTRIAXone (ROCEPHIN) injection 1 g (1 g Intramuscular Given 03/01/24 1602)  lidocaine  (PF) (XYLOCAINE ) 1 % injection (2 mLs  Given 03/01/24 1602)                                    Medical Decision Making Patient has a proximately 4 cm swollen area of the dorsum of his left hand.  Amount and/or Complexity of Data Reviewed Independent Historian: spouse    Details: Patient is here with his wife who is supportive Radiology: ordered and independent interpretation performed. Decision-making details documented in ED Course.    Details: X-ray left  hand shows no acute findings  Risk Prescription drug management. Risk Details: I unroofed the top of scabbed swollen area and was able to express purulent drainage.  Patient soaked while here.  Patient is given Rocephin 1 g IM he is given a prescription for doxycycline  patient is advised to soak area 20 minutes 4 times a day for the next 2 days.  He is advised to return to the emergency department if symptoms worsen or change.        Final diagnoses:  Abscess of left hand    ED Discharge Orders          Ordered    doxycycline  (VIBRAMYCIN ) 100 MG capsule  2 times daily        03/01/24 1610            An After Visit Summary was printed and given to the patient.    Ronald Mitchell, [place] 03/01/24 1624    Ronald Mitchell 03/02/24 [phone]

## 2024-03-04 ENCOUNTER — Other Ambulatory Visit: Payer: Self-pay

## 2024-03-04 ENCOUNTER — Encounter (HOSPITAL_COMMUNITY): Payer: Self-pay

## 2024-03-04 ENCOUNTER — Emergency Department (HOSPITAL_COMMUNITY)
Admission: EM | Admit: 2024-03-04 | Discharge: 2024-03-04 | Disposition: A | Discharge status: Home / Self Care | Payer: Self-pay | Attending: Emergency Medicine | Admitting: Emergency Medicine

## 2024-03-04 DIAGNOSIS — L03113 Cellulitis of right upper limb: Secondary | ICD-10-CM | POA: Insufficient documentation

## 2024-03-04 DIAGNOSIS — F29 Unspecified psychosis not due to a substance or known physiological condition: Secondary | ICD-10-CM | POA: Insufficient documentation

## 2024-03-04 DIAGNOSIS — Z7982 Long term (current) use of aspirin: Secondary | ICD-10-CM | POA: Insufficient documentation

## 2024-03-04 DIAGNOSIS — F12280 Cannabis dependence with cannabis-induced anxiety disorder: Secondary | ICD-10-CM | POA: Insufficient documentation

## 2024-03-04 DIAGNOSIS — G4700 Insomnia, unspecified: Secondary | ICD-10-CM | POA: Insufficient documentation

## 2024-03-04 DIAGNOSIS — F32A Depression, unspecified: Secondary | ICD-10-CM | POA: Insufficient documentation

## 2024-03-04 DIAGNOSIS — F409 Phobic anxiety disorder, unspecified: Secondary | ICD-10-CM | POA: Insufficient documentation

## 2024-03-04 DIAGNOSIS — F411 Generalized anxiety disorder: Secondary | ICD-10-CM

## 2024-03-04 DIAGNOSIS — Z79899 Other long term (current) drug therapy: Secondary | ICD-10-CM | POA: Insufficient documentation

## 2024-03-04 DIAGNOSIS — R45851 Suicidal ideations: Secondary | ICD-10-CM | POA: Insufficient documentation

## 2024-03-04 LAB — COMPREHENSIVE METABOLIC PANEL WITH GFR
ALT: 35 U/L (ref 0–44)
AST: 26 U/L (ref 15–41)
Albumin: 4.6 g/dL (ref 3.5–5.0)
Alkaline Phosphatase: 69 U/L (ref 38–126)
Anion gap: 14 (ref 5–15)
BUN: 15 mg/dL (ref 6–20)
CO2: 23 mmol/L (ref 22–32)
Calcium: 9.7 mg/dL (ref 8.9–10.3)
Chloride: 103 mmol/L (ref 98–111)
Creatinine, Ser: 0.93 mg/dL (ref 0.61–1.24)
GFR, Estimated: >60 mL/min (ref >60)
Glucose, Bld: 106 mg/dL — ABNORMAL HIGH (ref 70–99)
Potassium: 3.8 mmol/L (ref 3.5–5.1)
Sodium: 140 mmol/L (ref 135–145)
Total Bilirubin: 0.6 mg/dL (ref 0.0–1.2)
Total Protein: 7.8 g/dL (ref 6.5–8.1)

## 2024-03-04 LAB — URINE DRUG SCREEN
Amphetamines: NEGATIVE
Barbiturates: NEGATIVE
Benzodiazepines: NEGATIVE
Cocaine: NEGATIVE
Fentanyl: NEGATIVE
Methadone Scn, Ur: NEGATIVE
Opiates: NEGATIVE
Tetrahydrocannabinol: POSITIVE — AB

## 2024-03-04 LAB — CBC WITH DIFFERENTIAL/PLATELET
Abs Immature Granulocytes: 0.05 K/uL (ref 0.00–0.07)
Basophils Absolute: 0.1 K/uL (ref 0.0–0.1)
Basophils Relative: 1 %
Eosinophils Absolute: 0.2 K/uL (ref 0.0–0.5)
Eosinophils Relative: 2 %
HCT: 45.1 % (ref 39.0–52.0)
Hemoglobin: 15.5 g/dL (ref 13.0–17.0)
Immature Granulocytes: 1 %
Lymphocytes Relative: 26 %
Lymphs Abs: 2.8 K/uL (ref 0.7–4.0)
MCH: 31.4 pg (ref 26.0–34.0)
MCHC: 34.4 g/dL (ref 30.0–36.0)
MCV: 91.3 fL (ref 80.0–100.0)
Monocytes Absolute: 0.7 K/uL (ref 0.1–1.0)
Monocytes Relative: 6 %
Neutro Abs: 7.1 K/uL (ref 1.7–7.7)
Neutrophils Relative %: 64 %
Platelets: 328 K/uL (ref 150–400)
RBC: 4.94 MIL/uL (ref 4.22–5.81)
RDW: 13.2 % (ref 11.5–15.5)
WBC: 10.9 K/uL — ABNORMAL HIGH (ref 4.0–10.5)
nRBC: 0 % (ref 0.0–0.2)

## 2024-03-04 LAB — ETHANOL: Alcohol, Ethyl (B): <15 mg/dL (ref <15)

## 2024-03-04 MED ORDER — VANCOMYCIN HCL IN DEXTROSE 1-5 GM/200ML-% IV SOLN: 1000.0000 mg | Freq: Once | INTRAVENOUS | Status: DC

## 2024-03-04 MED ORDER — TRAZODONE HCL 50 MG PO TABS
50.0000 mg | ORAL_TABLET | Freq: Every day | ORAL | Status: DC
  Administered 2024-03-04: 50 mg via ORAL
  Filled 2024-03-04: qty 1

## 2024-03-04 MED ORDER — HYDROXYZINE HCL 25 MG PO TABS: 25.0000 mg | ORAL_TABLET | Freq: Three times a day (TID) | ORAL | 0 refills | Status: AC | PRN

## 2024-03-04 MED ORDER — LIDOCAINE HCL (PF) 2 % IJ SOLN: 10.0000 mL | Freq: Once | INTRAMUSCULAR | Status: DC

## 2024-03-04 MED ORDER — TRAZODONE HCL 50 MG PO TABS: 50.0000 mg | ORAL_TABLET | Freq: Every day | ORAL | 1 refills | Status: AC

## 2024-03-04 MED ORDER — VANCOMYCIN HCL 2000 MG/400ML IV SOLN
2000.0000 mg | Freq: Once | INTRAVENOUS | Status: AC
  Administered 2024-03-04: 2000 mg via INTRAVENOUS
  Filled 2024-03-04: qty 400

## 2024-03-04 MED ORDER — HYDROXYZINE HCL 25 MG PO TABS
25.0000 mg | ORAL_TABLET | Freq: Three times a day (TID) | ORAL | Status: DC | PRN
  Administered 2024-03-04: 25 mg via ORAL
  Filled 2024-03-04: qty 1

## 2024-03-04 MED ORDER — DOXYCYCLINE HYCLATE 100 MG PO TABS
100.0000 mg | ORAL_TABLET | Freq: Two times a day (BID) | ORAL | Status: DC
  Administered 2024-03-04: 100 mg via ORAL
  Filled 2024-03-04: qty 1

## 2024-03-04 NOTE — ED Triage Notes (Signed)
 Pt to er, pt states that he is here for an abscess on his L hand, states that the infection seems to be going up his arm, states that he was treating himself with amox at home, states that he was in yesterday and they gave him and abx.  States that he is here today because it is hard to concentrate on things.

## 2024-03-04 NOTE — Discharge Instructions (Addendum)
 Follow-up for counseling for depression as suggested by the behavioral health person.  Also we will give you the name of an orthopedic surgeon to follow-up on with your hand you the end of this week or next week.  Make sure you keep taking her doxycycline 

## 2024-03-04 NOTE — ED Provider Notes (Signed)
 [place] EMERGENCY DEPARTMENT AT [facility] Provider Note   CSN: [id number] Arrival date & time: 03/04/24  1237     Patient presents with: Abscess   Ronald Mitchell is a 38 y.o. male.   Patient was brought in for depression and suicidal thoughts.  Patient is presently taking doxycycline  for an infection in his right hand that is improving  The history is provided by the patient.  Altered Mental Status Presenting symptoms: behavior changes   Severity:  Mild Most recent episode:  Today Episode history:  Single Timing:  Constant Progression:  Improving Chronicity:  New Context: not alcohol use   Associated symptoms: no abdominal pain, no hallucinations, no headaches, no rash and no seizures        Prior to Admission medications   Medication Sig Start Date End Date Taking? Authorizing Provider  aspirin EC 81 MG tablet Take 324 mg by mouth as needed.    [provider]  doxycycline  (VIBRAMYCIN ) 100 MG capsule Take 1 capsule (100 mg total) by mouth 2 (two) times daily. 03/01/24   Sofia, Leslie K, PA-C  ibuprofen  (ADVIL ) 800 MG tablet Take 1 tablet (800 mg total) by mouth every 8 (eight) hours as needed. 02/06/19   Lawyer, Lonni, PA-C  oxyCODONE -acetaminophen  (PERCOCET) 5-325 MG tablet Take 1 tablet by mouth every 4 (four) hours as needed for moderate pain. 06/12/21   Raford Lenis, MD  oxyCODONE -acetaminophen  (PERCOCET) 5-325 MG tablet Take 1 tablet by mouth every 4 (four) hours as needed for moderate pain. 06/12/21   Raford Lenis, MD    Allergies: Sulfa antibiotics and Yellow jacket venom [bee venom]    Review of Systems  Constitutional:  Negative for appetite change and fatigue.  HENT:  Negative for congestion, ear discharge and sinus pressure.   Eyes:  Negative for discharge.  Respiratory:  Negative for cough.   Cardiovascular:  Negative for chest pain.  Gastrointestinal:  Negative for abdominal pain and diarrhea.  Genitourinary:  Negative for  frequency and hematuria.  Musculoskeletal:  Negative for back pain.  Skin:  Negative for rash.  Neurological:  Negative for seizures and headaches.  Psychiatric/Behavioral:  Negative for hallucinations.        Depression    Updated Vital Signs BP (!) 138/100   Pulse 89   Temp 98.9 F (37.2 C) (Oral)   Resp 18   Ht 5' 6 (1.676 m)   Wt 108.9 kg   SpO2 96%   BMI 38.74 kg/m   Physical Exam Vitals and nursing note reviewed.  Constitutional:      Appearance: He is well-developed.  HENT:     Head: Normocephalic.     Nose: Nose normal.  Eyes:     General: No scleral icterus.    Conjunctiva/sclera: Conjunctivae normal.  Neck:     Thyroid: No thyromegaly.  Cardiovascular:     Rate and Rhythm: Normal rate and regular rhythm.     Heart sounds: No murmur heard.    No friction rub. No gallop.  Pulmonary:     Breath sounds: No stridor. No wheezing or rales.  Chest:     Chest wall: No tenderness.  Abdominal:     General: There is no distension.     Tenderness: There is no abdominal tenderness. There is no rebound.  Musculoskeletal:        General: Normal range of motion.     Cervical back: Neck supple.     Comments: Healing patient's right hand  Lymphadenopathy:  Cervical: No cervical adenopathy.  Skin:    Findings: No erythema or rash.  Neurological:     Mental Status: He is alert and oriented to person, place, and time.     Motor: No abnormal muscle tone.     Coordination: Coordination normal.  Psychiatric:     Comments: Depression but not suicidal or homicidal     (all labs ordered are listed, but only abnormal results are displayed) Labs Reviewed  CBC WITH DIFFERENTIAL/PLATELET  COMPREHENSIVE METABOLIC PANEL WITH GFR  ETHANOL  URINE DRUG SCREEN    EKG: None  Radiology: No results found.   Procedures   Medications Ordered in the ED  lidocaine  HCl (PF) (XYLOCAINE ) 2 % injection 10 mL (has no administration in time range)  vancomycin (VANCOREADY)  IVPB 2000 mg/400 mL (has no administration in time range)                                    Medical Decision Making Amount and/or Complexity of Data Reviewed Labs: ordered.  Risk Prescription drug management.   Patient was thoughts and depression.  Also cellulitis to hand.  Patient states he is not suicidal and will follow-up as an outpatient he is prescribed Vistaril and trazodone and will take doxycycline  for his hand     Final diagnoses:  None    ED Discharge Orders     None          Suzette Pac, MD 03/07/24 1430

## 2024-03-04 NOTE — ED Notes (Signed)
TTS completed at this time. 

## 2024-03-04 NOTE — ED Notes (Signed)
 Pt remains calm and cooperative. Pt feels safe with DC plan. Awaiting wife to return with pt clothes and to transport pt home. Night time meds to be given at 2100 prior to DC.

## 2024-03-04 NOTE — BH Assessment (Signed)
 TTS Consult has been deferred to [facility]. [facility] to provide time for patient to be seen. Secure chat initiated with [facility] and providers.

## 2024-03-04 NOTE — Consult Note (Signed)
 [facility] Psychiatric Consult Initial  Patient Name: .Ronald Mitchell  MRN: [id number]  DOB: May 08, 1986  Consult Order details:  Orders (From admission, onward)     Start     Ordered   03/04/24 1547  CONSULT TO CALL ACT TEAM       Ordering Provider: Suzette Pac, MD  Provider:  (Not yet assigned)  Question:  Reason for Consult?  Answer:  depression   03/04/24 1548             Mode of Visit: Tele-visit Virtual Statement:TELE PSYCHIATRY ATTESTATION & CONSENT As the provider for this telehealth consult, I attest that I verified the patient's identity using two separate identifiers, introduced myself to the patient, provided my credentials, disclosed my location, and performed this encounter via a HIPAA-compliant, real-time, face-to-face, two-way, interactive audio and video platform and with the full consent and agreement of the patient (or guardian as applicable.) Patient physical location: [place]. Telehealth provider physical location: home office in state of [place].   Video start time: 1700 Video end time: 1730    Psychiatry Consult Evaluation  Service Date: March 04, 2024 LOS:  LOS: 0 days  Chief Complaint "I haven't been sleeping. I had a dream that scared me and I can't stop thinking about it."  Primary Psychiatric Diagnoses  Generalized Anxiety Disorder (acute exacerbation) 2.   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  Cannabis-induced anxiety disorder, with onset during withdrawal (provisional)  Assessment  Ronald Mitchell is a 38 y.o. male admitted: Presented to the EDfor 03/04/2024 12:53 PM for "I haven't been sleeping. I had a dream that scared me and I can't stop thinking about it." He carries the psychiatric diagnoses of none and has a past medical history of cellulitis.   38 year old male presenting with acute anxiety, intrusive thoughts, and insomnia following cessation of marijuana vaping. Denies active SI/HI but describes ego-dystonic intrusive imagery related to his  family, which has caused profound distress and sleep disruption.  Patient demonstrates good insight, strong family support, and protective factors including commitment to his spouse and children. There is no evidence of psychosis or mania. However, due to recent intrusive suicidal thoughts and severe anxiety, overnight observation is recommended for stabilization, sleep monitoring, and medication response evaluation. Please see plan below for detailed recommendations.   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 Initiate Trazodone 50 mg PO at bedtime for insomnia. Monitor for sedation or paradoxical restlessness overnight.  ## Medical Decision Making Capacity: Not specifically addressed in this encounter  ## Further Work-up:  -- No further workup needed at this time UDS -- Updated EKG ordered on 03/04/2024 -- Pertinent labwork reviewed earlier this admission includes: CBC, UDS, EKG, CMP   ## Disposition:-- Recommend overnight observation for continued evaluation, stabilization, and monitoring of response to anxiolytic and sleep medication. If stable by morning with no recurrence of suicidal thoughts or unsafe behavior, may be discharged home with outpatient therapy and psychiatric follow-up referrals.  ## Behavioral / Environmental: -To minimize splitting of staff, assign one staff person to communicate all information from the team when feasible. or Utilize compassion and acknowledge the patient's experiences while setting clear and realistic expectations for care.    ## Safety and Observation Level:  - Based on my clinical evaluation, I estimate the  patient to be at low risk of self harm in the current setting. - At this time, we recommend  routine. This decision is based  on my review of the chart including patient's history and current presentation, interview of the patient, mental status examination, and consideration of suicide risk including evaluating suicidal ideation, plan, intent, suicidal or self-harm behaviors, risk factors, and protective factors. This judgment is based on our ability to directly address suicide risk, implement suicide prevention strategies, and develop a safety plan while the patient is in the clinical setting. Please contact our team if there is a concern that risk level has changed.  CSSR Risk Category:C-SSRS RISK CATEGORY: No Risk  Suicide Risk Assessment: Patient has following modifiable risk factors for suicide: current symptoms: anxiety/panic, insomnia, impulsivity, anhedonia, hopelessness and triggering events, which we are addressing by recommended overnight observation for continued evaluation. Patient has following non-modifiable or demographic risk factors for suicide: male gender Patient has the following protective factors against suicide: Supportive family, Minor children in the home, no history of suicide attempts, and no history of NSSIB  Thank you for this consult request. Recommendations have been communicated to the primary team.  We will continue to follow patient at this time.   Ronald Mitchell, PMHNP       History of Present Illness  Relevant Aspects of Hospital ED Course:  Admitted on 03/04/2024 for "I haven't been sleeping. I had a dream that scared me and I can't stop thinking about it."  Patient Report:  38 year old male presented to the emergency department initially for an abscess on his left hand due to infection. During triage, patient became tearful and disclosed having thoughts of suicide without a plan.  On evaluation, the patient was accompanied by his wife, Ronald Mitchell, and requested that she remain in the room during the assessment. He reports that over the past two months, he has been using a  marijuana vape pen daily. Approximately one week ago, he began feeling guilt and religious conflict about using the vape pen and decided to stop abruptly on Wednesday.  The following evening, he had a vivid dream in which he harmed his children. Since then, he reports experiencing intense anxiety, fear, and ruminative thoughts about the dream. He describes feeling terrified that he could have such a thought and reports significant insomnia, averaging only 3 hours of sleep per night since the incident.  He currently denies suicidal or homicidal ideation, auditory or visual hallucinations, or delusional beliefs. He states that while he "just wants the thoughts to stop," he is adamant that he would never harm his wife or children. He emphasizes being a devoted husband and father, and that these intrusive thoughts have caused severe distress.  He reports no prior psychiatric history, denies previous therapy or medication use, and denies use of other drugs or alcohol. He works full-time as a Psychologist, occupational and expresses concern about returning to work while exhausted.  Psych ROS:  Depression: Denies Anxiety: Endorses Mania (lifetime and current): Denies Psychosis: (lifetime and current): Denies  Collateral information:  Ronald Mitchell confirms that her husband's behavior over the last week has been uncharacteristically anxious and withdrawn, noting that he has stopped eating and sleeping normally. She reports he has become fixated on the dream and the guilt surrounding it but insists he has never shown aggression or violent behavior toward anyone.  She reports he has remained loving, remorseful, and fearful of his intrusive thoughts, which she believes stem from sleep deprivation and stress. She states that  she does not believe he is a danger to himself or others and supports outpatient treatment.  Together they have been using healthy distraction techniques, such as reading, walking, and spending time with their  children, which she feels have been somewhat helpful.  ROS   Psychiatric and Social History  Psychiatric History:  Information collected from patient and patient's wife  Prev Dx/Sx: None Current Psych Provider: None Home Meds (current): None Previous Med Trials: Denies Therapy: Denies  Prior Psych Hospitalization: Denies Prior Self Harm: Denies Prior Violence: Denies  Family Psych History: Denies Family Hx suicide: Denies  Social History:  Developmental Hx: Deferred Educational Hx: Graduated high school Occupational Hx: Works as a Banker Hx: Denies Living Situation: Lives with wife and children Spiritual Hx: Yes Access to weapons/lethal means: Denies  Substance History Denies any current or past substance abuse history, except for marijuana daily for 2 months  Exam Findings  Physical Exam:  Vital Signs:  Temp:  [98.7 F (37.1 C)-98.9 F (37.2 C)] 98.7 F (37.1 C) (10/14 1721) Pulse Rate:  [89] 89 (10/14 1246) Resp:  [18] 18 (10/14 1246) BP: (138)/(100) 138/100 (10/14 1246) SpO2:  [96 %] 96 % (10/14 1246) Weight:  [108.9 kg] 108.9 kg (10/14 1248) Blood pressure (!) 138/100, pulse 89, temperature 98.7 F (37.1 C), temperature source Oral, resp. rate 18, height 5' 6 (1.676 m), weight 108.9 kg, SpO2 96%. Body mass index is 38.74 kg/m.  Physical Exam  Mental Status Exam: General Appearance: Well-developed male, alert, appropriately dressed.  Orientation:  Full (Time, Place, and Person)  Memory:  Immediate;   Fair Recent;   Fair  Concentration:  Concentration: Fair  Recall:  Fair  Attention  Fair  Eye Contact:  Fair  Speech:  Clear and Coherent  Language:  Fair  Volume:  Normal  Mood: "Anxious and tired."  Affect:  Congruent, tearful, restricted.  Thought Process:  Goal Directed  Thought Content:  Denies current SI/HI/AVH. Expresses intrusive thoughts associated with dream but recognizes they are ego-dystonic (distressing and unwanted).  Suicidal  Thoughts:  Denies current SI  Homicidal Thoughts:  No  Judgement:  Intact - able to contract for safety and accept support.  Insight:  Fair - recognizes abnormality of thoughts and seeks help.  Psychomotor Activity:  Normal  Akathisia:  No  Fund of Knowledge:  Fair      Assets:  Manufacturing systems engineer Desire for Improvement Financial Resources/Insurance Housing Social Support  Cognition:  WNL  ADL's:  Intact  AIMS (if indicated):        Other History   These have been pulled in through the EMR, reviewed, and updated if appropriate.  Family History:  The patient's family history is not on file.  Medical History: Past Medical History:  Diagnosis Date   Arthritis    Asthma    Gout     Surgical History: Past Surgical History:  Procedure Laterality Date   HAND SURGERY       Medications:   Current Facility-Administered Medications:    lidocaine  HCl (PF) (XYLOCAINE ) 2 % injection 10 mL, 10 mL, Intradermal, Once, Zammit, Joseph, MD   vancomycin (VANCOREADY) IVPB 2000 mg/400 mL, 2,000 mg, Intravenous, Once, Ronald Pac, MD, Last Rate: 200 mL/hr at 03/04/24 1702, 2,000 mg at 03/04/24 1702  Current Outpatient Medications:    doxycycline  (VIBRAMYCIN ) 100 MG capsule, Take 1 capsule (100 mg total) by mouth 2 (two) times daily., Disp: 20 capsule, Rfl: 0   ibuprofen  (ADVIL ) 200 MG tablet, Take  800 mg by mouth every 6 (six) hours as needed for mild pain (pain score 1-3)., Disp: , Rfl:   Allergies: Allergies  Allergen Reactions   Bee Venom Anaphylaxis   Sulfa Antibiotics Anaphylaxis, Shortness Of Breath and Swelling   Other Itching    Propoxyphene N-Acetaminophen     Evangelina Delancey MOTLEY-MANGRUM, PMHNP

## 2024-03-04 NOTE — ED Notes (Signed)
 Pt tearful, pt now saying that he is having thoughts of si without a plan.  Charge RN notified, pt to hallway by nurses station.
